# Patient Record
Sex: Female | Born: 1980 | Race: Black or African American | Hispanic: No | Marital: Single | Smoking: Never smoker
Health system: Southern US, Community
[De-identification: ages and names within clinical notes are randomized; demographics above are authoritative.]

## PROBLEM LIST (undated history)

## (undated) DIAGNOSIS — E669 Obesity, unspecified: Secondary | ICD-10-CM

## (undated) DIAGNOSIS — K219 Gastro-esophageal reflux disease without esophagitis: Secondary | ICD-10-CM

---
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
## 2000-02-01 ENCOUNTER — Encounter: Admission: RE | Admit: 2000-02-01 | Discharge: 2000-02-01 | Payer: Self-pay | Admitting: Family Medicine

## 2000-02-15 ENCOUNTER — Emergency Department (HOSPITAL_COMMUNITY): Admission: EM | Admit: 2000-02-15 | Discharge: 2000-02-15 | Payer: Self-pay | Admitting: Emergency Medicine

## 2000-02-15 ENCOUNTER — Encounter: Payer: Self-pay | Admitting: Emergency Medicine

## 2000-02-29 ENCOUNTER — Encounter: Admission: RE | Admit: 2000-02-29 | Discharge: 2000-02-29 | Payer: Self-pay | Admitting: Family Medicine

## 2000-04-06 ENCOUNTER — Encounter: Admission: RE | Admit: 2000-04-06 | Discharge: 2000-04-06 | Payer: Self-pay | Admitting: Family Medicine

## 2000-04-11 ENCOUNTER — Encounter: Admission: RE | Admit: 2000-04-11 | Discharge: 2000-04-11 | Payer: Self-pay | Admitting: *Deleted

## 2000-04-18 ENCOUNTER — Encounter: Admission: RE | Admit: 2000-04-18 | Discharge: 2000-04-18 | Payer: Self-pay | Admitting: Family Medicine

## 2000-05-18 ENCOUNTER — Encounter: Admission: RE | Admit: 2000-05-18 | Discharge: 2000-05-18 | Payer: Self-pay | Admitting: Family Medicine

## 2000-07-10 ENCOUNTER — Encounter: Admission: RE | Admit: 2000-07-10 | Discharge: 2000-07-10 | Payer: Self-pay | Admitting: Family Medicine

## 2000-07-10 ENCOUNTER — Other Ambulatory Visit: Admission: RE | Admit: 2000-07-10 | Discharge: 2000-07-10 | Payer: Self-pay | Admitting: *Deleted

## 2000-07-19 ENCOUNTER — Encounter: Payer: Self-pay | Admitting: Emergency Medicine

## 2000-07-19 ENCOUNTER — Emergency Department (HOSPITAL_COMMUNITY): Admission: EM | Admit: 2000-07-19 | Discharge: 2000-07-19 | Payer: Self-pay | Admitting: Emergency Medicine

## 2000-07-24 ENCOUNTER — Encounter: Admission: RE | Admit: 2000-07-24 | Discharge: 2000-07-24 | Payer: Self-pay | Admitting: Family Medicine

## 2000-08-22 ENCOUNTER — Encounter: Admission: RE | Admit: 2000-08-22 | Discharge: 2000-08-22 | Payer: Self-pay | Admitting: Family Medicine

## 2000-10-04 ENCOUNTER — Encounter: Admission: RE | Admit: 2000-10-04 | Discharge: 2000-10-04 | Payer: Self-pay | Admitting: Sports Medicine

## 2001-03-06 ENCOUNTER — Other Ambulatory Visit: Admission: RE | Admit: 2001-03-06 | Discharge: 2001-03-06 | Payer: Self-pay | Admitting: Sports Medicine

## 2001-03-06 ENCOUNTER — Encounter: Admission: RE | Admit: 2001-03-06 | Discharge: 2001-03-06 | Payer: Self-pay | Admitting: Family Medicine

## 2001-05-20 ENCOUNTER — Encounter: Admission: RE | Admit: 2001-05-20 | Discharge: 2001-05-20 | Payer: Self-pay | Admitting: Family Medicine

## 2001-06-07 ENCOUNTER — Encounter: Admission: RE | Admit: 2001-06-07 | Discharge: 2001-06-07 | Payer: Self-pay | Admitting: Family Medicine

## 2001-07-09 ENCOUNTER — Encounter: Admission: RE | Admit: 2001-07-09 | Discharge: 2001-07-09 | Payer: Self-pay | Admitting: Family Medicine

## 2001-07-22 ENCOUNTER — Encounter: Admission: RE | Admit: 2001-07-22 | Discharge: 2001-07-22 | Payer: Self-pay | Admitting: Family Medicine

## 2001-07-30 ENCOUNTER — Encounter: Admission: RE | Admit: 2001-07-30 | Discharge: 2001-07-30 | Payer: Self-pay | Admitting: Sports Medicine

## 2001-07-30 ENCOUNTER — Encounter: Payer: Self-pay | Admitting: Sports Medicine

## 2001-09-04 ENCOUNTER — Encounter: Admission: RE | Admit: 2001-09-04 | Discharge: 2001-09-04 | Payer: Self-pay | Admitting: Family Medicine

## 2001-10-21 ENCOUNTER — Encounter: Admission: RE | Admit: 2001-10-21 | Discharge: 2001-10-21 | Payer: Self-pay | Admitting: Family Medicine

## 2001-12-15 ENCOUNTER — Emergency Department (HOSPITAL_COMMUNITY): Admission: EM | Admit: 2001-12-15 | Discharge: 2001-12-15 | Payer: Self-pay | Admitting: Emergency Medicine

## 2002-02-19 ENCOUNTER — Encounter: Admission: RE | Admit: 2002-02-19 | Discharge: 2002-02-19 | Payer: Self-pay | Admitting: Family Medicine

## 2002-02-19 ENCOUNTER — Other Ambulatory Visit: Admission: RE | Admit: 2002-02-19 | Discharge: 2002-02-19 | Payer: Self-pay | Admitting: Family Medicine

## 2002-03-13 ENCOUNTER — Encounter: Admission: RE | Admit: 2002-03-13 | Discharge: 2002-03-13 | Payer: Self-pay | Admitting: Family Medicine

## 2002-04-15 ENCOUNTER — Encounter: Admission: RE | Admit: 2002-04-15 | Discharge: 2002-04-15 | Payer: Self-pay | Admitting: Sports Medicine

## 2002-07-17 ENCOUNTER — Encounter: Admission: RE | Admit: 2002-07-17 | Discharge: 2002-07-17 | Payer: Self-pay | Admitting: Family Medicine

## 2002-10-23 ENCOUNTER — Encounter: Admission: RE | Admit: 2002-10-23 | Discharge: 2002-10-23 | Payer: Self-pay | Admitting: Family Medicine

## 2003-01-27 ENCOUNTER — Encounter: Admission: RE | Admit: 2003-01-27 | Discharge: 2003-01-27 | Payer: Self-pay | Admitting: Family Medicine

## 2003-10-16 ENCOUNTER — Encounter: Admission: RE | Admit: 2003-10-16 | Discharge: 2003-10-16 | Payer: Self-pay | Admitting: Family Medicine

## 2004-01-15 ENCOUNTER — Encounter: Admission: RE | Admit: 2004-01-15 | Discharge: 2004-01-15 | Payer: Self-pay | Admitting: Family Medicine

## 2004-01-21 ENCOUNTER — Encounter: Admission: RE | Admit: 2004-01-21 | Discharge: 2004-01-21 | Payer: Self-pay | Admitting: Family Medicine

## 2004-03-09 ENCOUNTER — Encounter: Admission: RE | Admit: 2004-03-09 | Discharge: 2004-03-09 | Payer: Self-pay | Admitting: Family Medicine

## 2004-03-24 ENCOUNTER — Encounter: Admission: RE | Admit: 2004-03-24 | Discharge: 2004-03-24 | Payer: Self-pay | Admitting: Sports Medicine

## 2004-04-27 ENCOUNTER — Encounter: Admission: RE | Admit: 2004-04-27 | Discharge: 2004-04-27 | Payer: Self-pay | Admitting: Family Medicine

## 2004-06-14 ENCOUNTER — Encounter: Admission: RE | Admit: 2004-06-14 | Discharge: 2004-06-14 | Payer: Self-pay | Admitting: Family Medicine

## 2004-09-01 ENCOUNTER — Ambulatory Visit: Payer: Self-pay | Admitting: Family Medicine

## 2004-09-06 ENCOUNTER — Ambulatory Visit: Payer: Self-pay | Admitting: Family Medicine

## 2004-09-22 ENCOUNTER — Ambulatory Visit: Payer: Self-pay | Admitting: Sports Medicine

## 2004-10-03 ENCOUNTER — Ambulatory Visit: Payer: Self-pay | Admitting: Family Medicine

## 2004-10-19 ENCOUNTER — Ambulatory Visit: Payer: Self-pay | Admitting: Family Medicine

## 2004-10-24 ENCOUNTER — Ambulatory Visit (HOSPITAL_COMMUNITY): Admission: RE | Admit: 2004-10-24 | Discharge: 2004-10-24 | Payer: Self-pay | Admitting: Family Medicine

## 2004-11-24 ENCOUNTER — Ambulatory Visit: Payer: Self-pay | Admitting: Family Medicine

## 2005-01-03 ENCOUNTER — Ambulatory Visit: Payer: Self-pay | Admitting: Family Medicine

## 2005-02-01 ENCOUNTER — Ambulatory Visit: Payer: Self-pay | Admitting: Family Medicine

## 2005-02-02 ENCOUNTER — Ambulatory Visit (HOSPITAL_COMMUNITY): Admission: RE | Admit: 2005-02-02 | Discharge: 2005-02-02 | Payer: Self-pay | Admitting: Family Medicine

## 2005-02-09 ENCOUNTER — Ambulatory Visit: Payer: Self-pay | Admitting: Family Medicine

## 2005-02-27 ENCOUNTER — Ambulatory Visit: Payer: Self-pay | Admitting: Family Medicine

## 2005-03-31 ENCOUNTER — Ambulatory Visit: Payer: Self-pay | Admitting: Family Medicine

## 2005-04-10 ENCOUNTER — Ambulatory Visit: Payer: Self-pay | Admitting: Family Medicine

## 2005-04-10 ENCOUNTER — Ambulatory Visit (HOSPITAL_COMMUNITY): Admission: RE | Admit: 2005-04-10 | Discharge: 2005-04-10 | Payer: Self-pay | Admitting: *Deleted

## 2005-04-24 ENCOUNTER — Ambulatory Visit: Payer: Self-pay | Admitting: Family Medicine

## 2005-05-09 ENCOUNTER — Ambulatory Visit: Payer: Self-pay | Admitting: Obstetrics & Gynecology

## 2005-05-09 ENCOUNTER — Observation Stay (HOSPITAL_COMMUNITY): Admission: AD | Admit: 2005-05-09 | Discharge: 2005-05-10 | Payer: Self-pay | Admitting: Obstetrics & Gynecology

## 2005-05-12 ENCOUNTER — Ambulatory Visit: Payer: Self-pay | Admitting: Family Medicine

## 2005-05-17 ENCOUNTER — Ambulatory Visit: Payer: Self-pay | Admitting: Family Medicine

## 2005-05-30 ENCOUNTER — Ambulatory Visit: Payer: Self-pay | Admitting: Family Medicine

## 2005-06-07 ENCOUNTER — Ambulatory Visit: Payer: Self-pay | Admitting: Sports Medicine

## 2005-06-09 ENCOUNTER — Ambulatory Visit: Payer: Self-pay | Admitting: Obstetrics & Gynecology

## 2005-06-12 ENCOUNTER — Ambulatory Visit: Payer: Self-pay | Admitting: Family Medicine

## 2005-06-13 ENCOUNTER — Ambulatory Visit: Payer: Self-pay | Admitting: *Deleted

## 2005-06-16 ENCOUNTER — Ambulatory Visit: Payer: Self-pay | Admitting: *Deleted

## 2005-06-20 ENCOUNTER — Inpatient Hospital Stay (HOSPITAL_COMMUNITY): Admission: AD | Admit: 2005-06-20 | Discharge: 2005-06-22 | Payer: Self-pay | Admitting: Obstetrics & Gynecology

## 2005-06-20 ENCOUNTER — Ambulatory Visit: Payer: Self-pay | Admitting: *Deleted

## 2005-07-31 ENCOUNTER — Ambulatory Visit: Payer: Self-pay | Admitting: Family Medicine

## 2005-09-04 ENCOUNTER — Ambulatory Visit: Payer: Self-pay | Admitting: Sports Medicine

## 2005-09-05 LAB — CONVERTED CEMR LAB
HDL: 54 mg/dL
LDL Cholesterol: 71 mg/dL
Triglycerides: 55 mg/dL

## 2005-10-06 ENCOUNTER — Encounter (INDEPENDENT_AMBULATORY_CARE_PROVIDER_SITE_OTHER): Payer: Self-pay | Admitting: *Deleted

## 2005-10-06 LAB — CONVERTED CEMR LAB

## 2005-10-24 ENCOUNTER — Other Ambulatory Visit: Admission: RE | Admit: 2005-10-24 | Discharge: 2005-10-24 | Payer: Self-pay | Admitting: Family Medicine

## 2005-10-24 ENCOUNTER — Ambulatory Visit: Payer: Self-pay | Admitting: Sports Medicine

## 2007-01-03 DIAGNOSIS — D259 Leiomyoma of uterus, unspecified: Secondary | ICD-10-CM | POA: Insufficient documentation

## 2007-01-03 DIAGNOSIS — K219 Gastro-esophageal reflux disease without esophagitis: Secondary | ICD-10-CM | POA: Insufficient documentation

## 2007-01-04 ENCOUNTER — Encounter (INDEPENDENT_AMBULATORY_CARE_PROVIDER_SITE_OTHER): Payer: Self-pay | Admitting: *Deleted

## 2007-02-18 ENCOUNTER — Encounter (INDEPENDENT_AMBULATORY_CARE_PROVIDER_SITE_OTHER): Payer: Self-pay | Admitting: Family Medicine

## 2007-02-18 ENCOUNTER — Other Ambulatory Visit: Admission: RE | Admit: 2007-02-18 | Discharge: 2007-02-18 | Payer: Self-pay | Admitting: Family Medicine

## 2007-02-18 ENCOUNTER — Ambulatory Visit: Payer: Self-pay | Admitting: Sports Medicine

## 2007-02-18 DIAGNOSIS — J309 Allergic rhinitis, unspecified: Secondary | ICD-10-CM | POA: Insufficient documentation

## 2007-02-18 DIAGNOSIS — E669 Obesity, unspecified: Secondary | ICD-10-CM

## 2007-02-18 DIAGNOSIS — J45909 Unspecified asthma, uncomplicated: Secondary | ICD-10-CM | POA: Insufficient documentation

## 2007-02-18 LAB — CONVERTED CEMR LAB
Chlamydia, DNA Probe: NEGATIVE
GC Probe Amp, Genital: NEGATIVE
Pap Smear: NORMAL

## 2007-06-11 ENCOUNTER — Ambulatory Visit: Payer: Self-pay | Admitting: Family Medicine

## 2007-08-01 ENCOUNTER — Ambulatory Visit: Payer: Self-pay | Admitting: Family Medicine

## 2007-08-01 ENCOUNTER — Telehealth (INDEPENDENT_AMBULATORY_CARE_PROVIDER_SITE_OTHER): Payer: Self-pay | Admitting: *Deleted

## 2007-08-01 ENCOUNTER — Encounter: Payer: Self-pay | Admitting: *Deleted

## 2007-09-20 ENCOUNTER — Ambulatory Visit: Payer: Self-pay | Admitting: Family Medicine

## 2007-09-20 ENCOUNTER — Encounter: Payer: Self-pay | Admitting: Family Medicine

## 2007-09-23 LAB — CONVERTED CEMR LAB: GC Probe Amp, Genital: NEGATIVE

## 2007-09-24 ENCOUNTER — Ambulatory Visit: Payer: Self-pay | Admitting: Family Medicine

## 2007-11-18 ENCOUNTER — Telehealth: Payer: Self-pay | Admitting: *Deleted

## 2007-11-19 ENCOUNTER — Encounter (INDEPENDENT_AMBULATORY_CARE_PROVIDER_SITE_OTHER): Payer: Self-pay | Admitting: Family Medicine

## 2007-11-19 ENCOUNTER — Ambulatory Visit: Payer: Self-pay | Admitting: Family Medicine

## 2007-11-19 LAB — CONVERTED CEMR LAB: GC Probe Amp, Genital: NEGATIVE

## 2008-01-16 ENCOUNTER — Telehealth: Payer: Self-pay | Admitting: *Deleted

## 2008-01-16 ENCOUNTER — Encounter: Payer: Self-pay | Admitting: Family Medicine

## 2008-01-16 ENCOUNTER — Ambulatory Visit: Payer: Self-pay | Admitting: Sports Medicine

## 2008-01-29 ENCOUNTER — Ambulatory Visit: Payer: Self-pay

## 2008-01-29 ENCOUNTER — Encounter (INDEPENDENT_AMBULATORY_CARE_PROVIDER_SITE_OTHER): Payer: Self-pay | Admitting: Family Medicine

## 2008-01-29 ENCOUNTER — Telehealth: Payer: Self-pay | Admitting: *Deleted

## 2008-03-24 ENCOUNTER — Emergency Department (HOSPITAL_COMMUNITY): Admission: EM | Admit: 2008-03-24 | Discharge: 2008-03-24 | Payer: Self-pay | Admitting: Emergency Medicine

## 2008-07-20 ENCOUNTER — Telehealth: Payer: Self-pay | Admitting: *Deleted

## 2008-07-20 ENCOUNTER — Ambulatory Visit: Payer: Self-pay | Admitting: Family Medicine

## 2008-07-20 ENCOUNTER — Encounter: Payer: Self-pay | Admitting: Family Medicine

## 2008-07-20 LAB — CONVERTED CEMR LAB
Beta hcg, urine, semiquantitative: NEGATIVE
Chlamydia, DNA Probe: NEGATIVE
GC Probe Amp, Genital: NEGATIVE

## 2008-07-21 ENCOUNTER — Encounter: Payer: Self-pay | Admitting: Family Medicine

## 2008-07-26 ENCOUNTER — Emergency Department (HOSPITAL_COMMUNITY): Admission: EM | Admit: 2008-07-26 | Discharge: 2008-07-26 | Payer: Self-pay | Admitting: Family Medicine

## 2008-08-05 ENCOUNTER — Ambulatory Visit: Payer: Self-pay | Admitting: Family Medicine

## 2008-08-05 LAB — CONVERTED CEMR LAB: Beta hcg, urine, semiquantitative: POSITIVE

## 2008-08-17 ENCOUNTER — Ambulatory Visit: Payer: Self-pay | Admitting: Family Medicine

## 2008-08-17 ENCOUNTER — Telehealth: Payer: Self-pay | Admitting: Family Medicine

## 2008-08-17 ENCOUNTER — Telehealth: Payer: Self-pay | Admitting: *Deleted

## 2008-08-24 ENCOUNTER — Ambulatory Visit: Payer: Self-pay | Admitting: Family Medicine

## 2008-08-24 ENCOUNTER — Encounter (INDEPENDENT_AMBULATORY_CARE_PROVIDER_SITE_OTHER): Payer: Self-pay | Admitting: Family Medicine

## 2008-08-24 LAB — CONVERTED CEMR LAB
ABO/RH(D): B POS
Antibody Screen: NEGATIVE
Basophils Absolute: 0 10*3/uL (ref 0.0–0.1)
Eosinophils Absolute: 0 10*3/uL (ref 0.0–0.7)
Hepatitis B Surface Ag: NEGATIVE
Lymphocytes Relative: 27 % (ref 12–46)
MCV: 84 fL (ref 78.0–100.0)
Monocytes Absolute: 0.6 10*3/uL (ref 0.1–1.0)
Rh Type: POSITIVE
Rubella: 194.9 intl units/mL — ABNORMAL HIGH
WBC: 4.6 10*3/uL (ref 4.0–10.5)

## 2008-08-25 ENCOUNTER — Encounter (INDEPENDENT_AMBULATORY_CARE_PROVIDER_SITE_OTHER): Payer: Self-pay | Admitting: Family Medicine

## 2008-08-31 ENCOUNTER — Encounter: Payer: Self-pay | Admitting: Family Medicine

## 2008-08-31 ENCOUNTER — Encounter (INDEPENDENT_AMBULATORY_CARE_PROVIDER_SITE_OTHER): Payer: Self-pay | Admitting: Family Medicine

## 2008-08-31 ENCOUNTER — Ambulatory Visit: Payer: Self-pay | Admitting: Family Medicine

## 2008-09-02 ENCOUNTER — Telehealth: Payer: Self-pay | Admitting: *Deleted

## 2008-09-02 ENCOUNTER — Encounter (INDEPENDENT_AMBULATORY_CARE_PROVIDER_SITE_OTHER): Payer: Self-pay | Admitting: Family Medicine

## 2008-09-04 ENCOUNTER — Ambulatory Visit: Payer: Self-pay | Admitting: Family Medicine

## 2008-09-07 ENCOUNTER — Ambulatory Visit: Payer: Self-pay | Admitting: Family Medicine

## 2008-09-07 ENCOUNTER — Telehealth: Payer: Self-pay | Admitting: *Deleted

## 2008-09-15 ENCOUNTER — Encounter: Payer: Self-pay | Admitting: Family Medicine

## 2008-09-15 ENCOUNTER — Telehealth: Payer: Self-pay | Admitting: *Deleted

## 2008-09-15 ENCOUNTER — Inpatient Hospital Stay (HOSPITAL_COMMUNITY): Admission: AD | Admit: 2008-09-15 | Discharge: 2008-09-16 | Payer: Self-pay | Admitting: Obstetrics & Gynecology

## 2008-09-21 ENCOUNTER — Ambulatory Visit (HOSPITAL_COMMUNITY): Admission: RE | Admit: 2008-09-21 | Discharge: 2008-09-21 | Payer: Self-pay | Admitting: Family Medicine

## 2008-09-29 ENCOUNTER — Ambulatory Visit (HOSPITAL_COMMUNITY): Admission: RE | Admit: 2008-09-29 | Discharge: 2008-09-29 | Payer: Self-pay | Admitting: Emergency Medicine

## 2008-09-29 ENCOUNTER — Encounter: Payer: Self-pay | Admitting: Family Medicine

## 2008-09-29 ENCOUNTER — Ambulatory Visit: Payer: Self-pay | Admitting: Family Medicine

## 2008-10-05 ENCOUNTER — Encounter (INDEPENDENT_AMBULATORY_CARE_PROVIDER_SITE_OTHER): Payer: Self-pay | Admitting: Family Medicine

## 2008-10-09 ENCOUNTER — Encounter (INDEPENDENT_AMBULATORY_CARE_PROVIDER_SITE_OTHER): Payer: Self-pay | Admitting: Family Medicine

## 2008-10-14 ENCOUNTER — Ambulatory Visit: Payer: Self-pay | Admitting: Family Medicine

## 2008-10-14 ENCOUNTER — Encounter: Payer: Self-pay | Admitting: *Deleted

## 2008-10-20 ENCOUNTER — Ambulatory Visit (HOSPITAL_COMMUNITY): Admission: RE | Admit: 2008-10-20 | Discharge: 2008-10-20 | Payer: Self-pay | Admitting: Emergency Medicine

## 2008-10-27 ENCOUNTER — Encounter (INDEPENDENT_AMBULATORY_CARE_PROVIDER_SITE_OTHER): Payer: Self-pay | Admitting: Family Medicine

## 2008-11-10 ENCOUNTER — Ambulatory Visit (HOSPITAL_COMMUNITY): Admission: RE | Admit: 2008-11-10 | Discharge: 2008-11-10 | Payer: Self-pay | Admitting: Family Medicine

## 2008-11-16 ENCOUNTER — Ambulatory Visit: Payer: Self-pay | Admitting: Family Medicine

## 2008-11-16 ENCOUNTER — Encounter (INDEPENDENT_AMBULATORY_CARE_PROVIDER_SITE_OTHER): Payer: Self-pay | Admitting: Family Medicine

## 2008-11-17 ENCOUNTER — Encounter (INDEPENDENT_AMBULATORY_CARE_PROVIDER_SITE_OTHER): Payer: Self-pay | Admitting: Family Medicine

## 2008-12-11 ENCOUNTER — Telehealth: Payer: Self-pay | Admitting: *Deleted

## 2008-12-17 ENCOUNTER — Encounter: Payer: Self-pay | Admitting: Family Medicine

## 2008-12-17 ENCOUNTER — Ambulatory Visit: Payer: Self-pay | Admitting: Family Medicine

## 2009-01-05 ENCOUNTER — Ambulatory Visit (HOSPITAL_COMMUNITY): Admission: RE | Admit: 2009-01-05 | Discharge: 2009-01-05 | Payer: Self-pay | Admitting: Family Medicine

## 2009-01-06 ENCOUNTER — Ambulatory Visit: Payer: Self-pay | Admitting: Family Medicine

## 2009-01-06 ENCOUNTER — Encounter: Payer: Self-pay | Admitting: Family Medicine

## 2009-01-06 LAB — CONVERTED CEMR LAB
HCT: 35.8 % — ABNORMAL LOW (ref 36.0–46.0)
Hemoglobin: 12.2 g/dL (ref 12.0–15.0)
MCHC: 34.1 g/dL (ref 30.0–36.0)
MCV: 83.4 fL (ref 78.0–100.0)
Platelets: 233 10*3/uL (ref 150–400)
RDW: 13.8 % (ref 11.5–15.5)

## 2009-01-18 ENCOUNTER — Telehealth (INDEPENDENT_AMBULATORY_CARE_PROVIDER_SITE_OTHER): Payer: Self-pay | Admitting: Family Medicine

## 2009-01-18 ENCOUNTER — Encounter (INDEPENDENT_AMBULATORY_CARE_PROVIDER_SITE_OTHER): Payer: Self-pay | Admitting: Family Medicine

## 2009-01-18 ENCOUNTER — Ambulatory Visit: Payer: Self-pay | Admitting: Family Medicine

## 2009-01-26 ENCOUNTER — Telehealth (INDEPENDENT_AMBULATORY_CARE_PROVIDER_SITE_OTHER): Payer: Self-pay | Admitting: Family Medicine

## 2009-01-26 ENCOUNTER — Ambulatory Visit: Payer: Self-pay | Admitting: Family Medicine

## 2009-02-01 ENCOUNTER — Ambulatory Visit: Payer: Self-pay | Admitting: Family Medicine

## 2009-02-17 ENCOUNTER — Ambulatory Visit: Payer: Self-pay | Admitting: Family Medicine

## 2009-02-19 ENCOUNTER — Encounter (INDEPENDENT_AMBULATORY_CARE_PROVIDER_SITE_OTHER): Payer: Self-pay | Admitting: Family Medicine

## 2009-02-19 ENCOUNTER — Ambulatory Visit (HOSPITAL_COMMUNITY): Admission: RE | Admit: 2009-02-19 | Discharge: 2009-02-19 | Payer: Self-pay | Admitting: Family Medicine

## 2009-02-24 ENCOUNTER — Encounter: Payer: Self-pay | Admitting: Family Medicine

## 2009-03-03 ENCOUNTER — Ambulatory Visit: Payer: Self-pay | Admitting: Family Medicine

## 2009-03-03 ENCOUNTER — Encounter (INDEPENDENT_AMBULATORY_CARE_PROVIDER_SITE_OTHER): Payer: Self-pay | Admitting: Family Medicine

## 2009-03-03 LAB — CONVERTED CEMR LAB
Chlamydia, DNA Probe: NEGATIVE
GC Probe Amp, Genital: NEGATIVE
Whiff Test: NEGATIVE

## 2009-03-04 ENCOUNTER — Encounter (INDEPENDENT_AMBULATORY_CARE_PROVIDER_SITE_OTHER): Payer: Self-pay | Admitting: Family Medicine

## 2009-03-08 ENCOUNTER — Ambulatory Visit: Payer: Self-pay | Admitting: Family Medicine

## 2009-03-16 ENCOUNTER — Ambulatory Visit: Payer: Self-pay | Admitting: Family Medicine

## 2009-03-19 ENCOUNTER — Ambulatory Visit (HOSPITAL_COMMUNITY): Admission: RE | Admit: 2009-03-19 | Discharge: 2009-03-19 | Payer: Self-pay | Admitting: Family Medicine

## 2009-03-19 ENCOUNTER — Encounter: Payer: Self-pay | Admitting: Family Medicine

## 2009-03-22 ENCOUNTER — Ambulatory Visit (HOSPITAL_COMMUNITY): Admission: RE | Admit: 2009-03-22 | Discharge: 2009-03-22 | Payer: Self-pay | Admitting: Family Medicine

## 2009-03-22 ENCOUNTER — Encounter: Payer: Self-pay | Admitting: Family Medicine

## 2009-03-24 ENCOUNTER — Ambulatory Visit: Payer: Self-pay | Admitting: Family Medicine

## 2009-03-24 LAB — CONVERTED CEMR LAB
Bilirubin Urine: NEGATIVE
Ketones, urine, test strip: NEGATIVE
Specific Gravity, Urine: 1.01
Urobilinogen, UA: 0.2

## 2009-03-25 ENCOUNTER — Ambulatory Visit: Payer: Self-pay | Admitting: Obstetrics & Gynecology

## 2009-03-25 ENCOUNTER — Inpatient Hospital Stay (HOSPITAL_COMMUNITY): Admission: AD | Admit: 2009-03-25 | Discharge: 2009-03-28 | Payer: Self-pay | Admitting: Obstetrics & Gynecology

## 2009-03-25 ENCOUNTER — Ambulatory Visit: Payer: Self-pay | Admitting: Family Medicine

## 2009-03-25 ENCOUNTER — Ambulatory Visit (HOSPITAL_COMMUNITY): Admission: RE | Admit: 2009-03-25 | Discharge: 2009-03-25 | Payer: Self-pay | Admitting: Family Medicine

## 2009-03-25 ENCOUNTER — Encounter (INDEPENDENT_AMBULATORY_CARE_PROVIDER_SITE_OTHER): Payer: Self-pay | Admitting: Family Medicine

## 2009-03-26 ENCOUNTER — Encounter (INDEPENDENT_AMBULATORY_CARE_PROVIDER_SITE_OTHER): Payer: Self-pay | Admitting: Family Medicine

## 2009-03-29 ENCOUNTER — Telehealth: Payer: Self-pay | Admitting: *Deleted

## 2009-04-02 ENCOUNTER — Encounter: Payer: Self-pay | Admitting: Family Medicine

## 2009-04-02 ENCOUNTER — Ambulatory Visit: Payer: Self-pay | Admitting: Family Medicine

## 2009-04-07 ENCOUNTER — Encounter (INDEPENDENT_AMBULATORY_CARE_PROVIDER_SITE_OTHER): Payer: Self-pay | Admitting: Family Medicine

## 2009-04-22 ENCOUNTER — Inpatient Hospital Stay (HOSPITAL_COMMUNITY): Admission: AD | Admit: 2009-04-22 | Discharge: 2009-04-22 | Payer: Self-pay | Admitting: Obstetrics & Gynecology

## 2009-04-22 ENCOUNTER — Ambulatory Visit: Payer: Self-pay | Admitting: Obstetrics and Gynecology

## 2009-04-28 ENCOUNTER — Ambulatory Visit: Payer: Self-pay | Admitting: Family Medicine

## 2009-04-28 ENCOUNTER — Encounter (INDEPENDENT_AMBULATORY_CARE_PROVIDER_SITE_OTHER): Payer: Self-pay | Admitting: Family Medicine

## 2009-04-28 LAB — CONVERTED CEMR LAB: Rapid Strep: NEGATIVE

## 2009-05-14 ENCOUNTER — Ambulatory Visit: Payer: Self-pay | Admitting: Family Medicine

## 2009-05-21 ENCOUNTER — Inpatient Hospital Stay (HOSPITAL_COMMUNITY): Admission: AD | Admit: 2009-05-21 | Discharge: 2009-05-21 | Payer: Self-pay | Admitting: Obstetrics & Gynecology

## 2009-08-04 ENCOUNTER — Encounter: Payer: Self-pay | Admitting: Family Medicine

## 2009-12-07 ENCOUNTER — Encounter: Payer: Self-pay | Admitting: *Deleted

## 2010-07-26 ENCOUNTER — Encounter: Payer: Self-pay | Admitting: Family Medicine

## 2010-07-26 ENCOUNTER — Ambulatory Visit: Payer: Self-pay | Admitting: Family Medicine

## 2010-07-26 LAB — CONVERTED CEMR LAB: Chlamydia, DNA Probe: NEGATIVE

## 2010-07-27 ENCOUNTER — Telehealth: Payer: Self-pay | Admitting: *Deleted

## 2010-08-03 LAB — CONVERTED CEMR LAB

## 2010-08-04 ENCOUNTER — Telehealth: Payer: Self-pay | Admitting: *Deleted

## 2010-08-18 ENCOUNTER — Ambulatory Visit: Payer: Self-pay | Admitting: Family Medicine

## 2010-08-18 DIAGNOSIS — R87610 Atypical squamous cells of undetermined significance on cytologic smear of cervix (ASC-US): Secondary | ICD-10-CM

## 2010-08-23 ENCOUNTER — Encounter: Payer: Self-pay | Admitting: Family Medicine

## 2010-09-03 IMAGING — US US OB FOLLOW-UP
1 series · 18 of 27 positions shown · non-contrast
Comparison: none

OBSTETRICAL ULTRASOUND:
 This ultrasound was performed in The [HOSPITAL], and the [facility] report will be stored to [REDACTED] PACS.

[Series 1: us ob follow-up · 27 acquisitions, 18 frames shown]
[im 1/27]
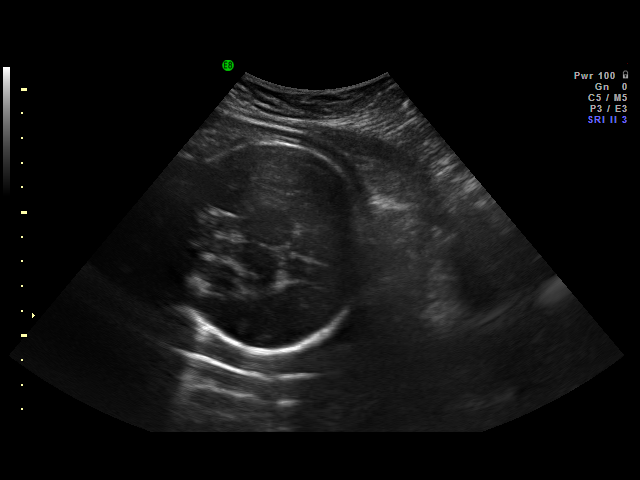
[im 3/27]
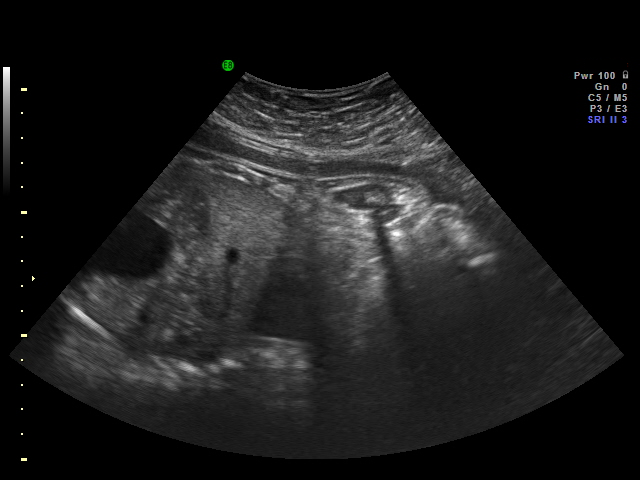
[im 4/27]
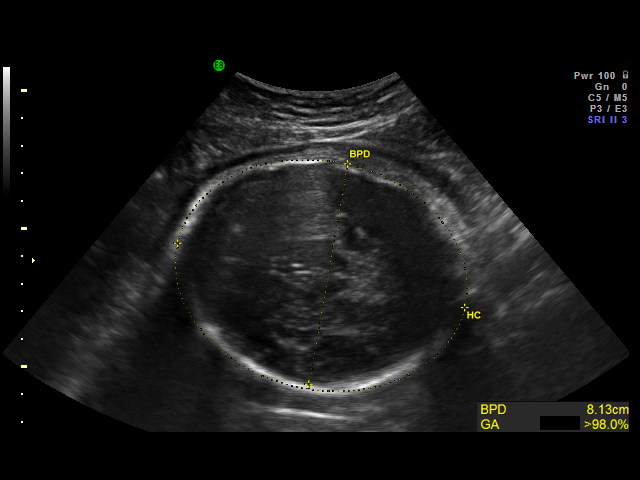
[im 6/27]
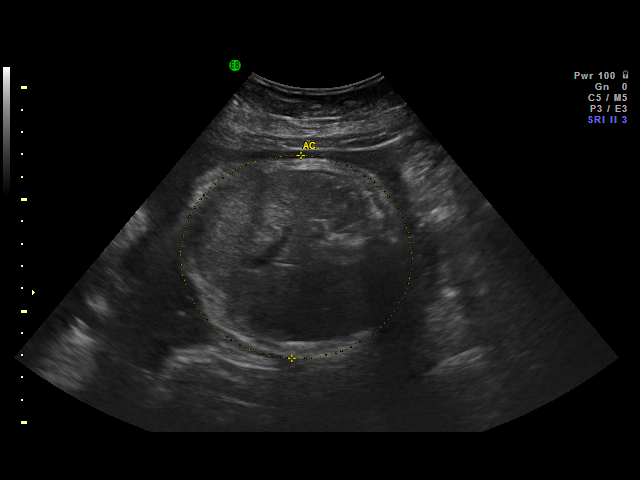
[im 7/27]
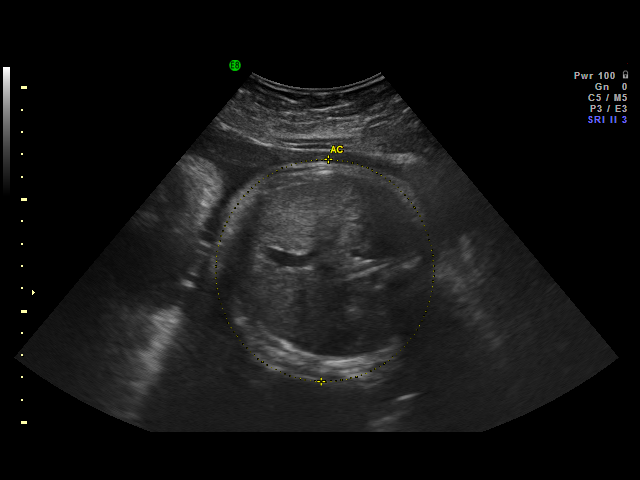
[im 9/27]
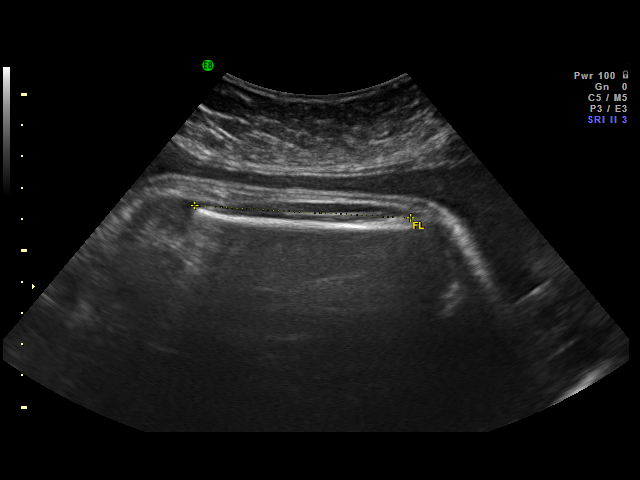
[im 10/27]
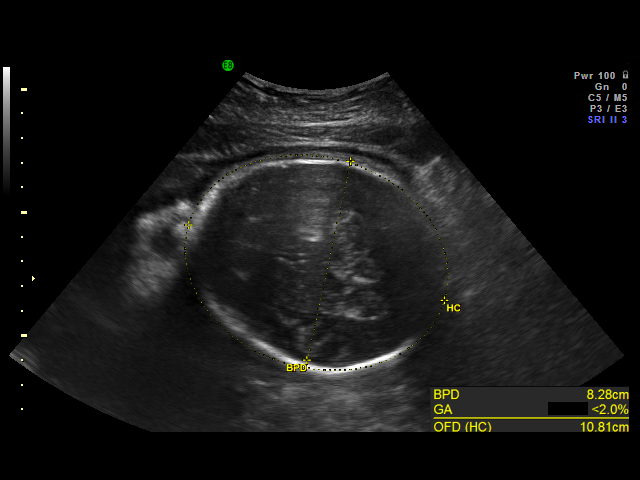
[im 12/27]
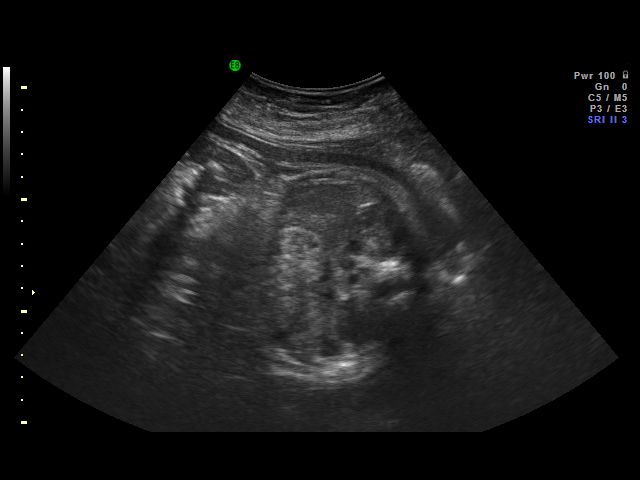
[im 13/27]
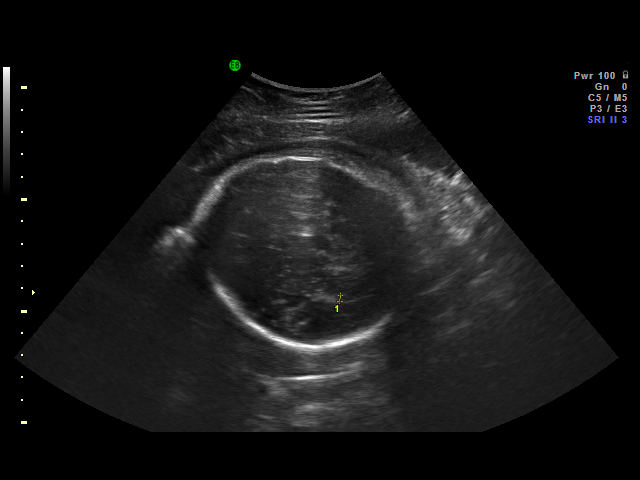
[im 15/27]
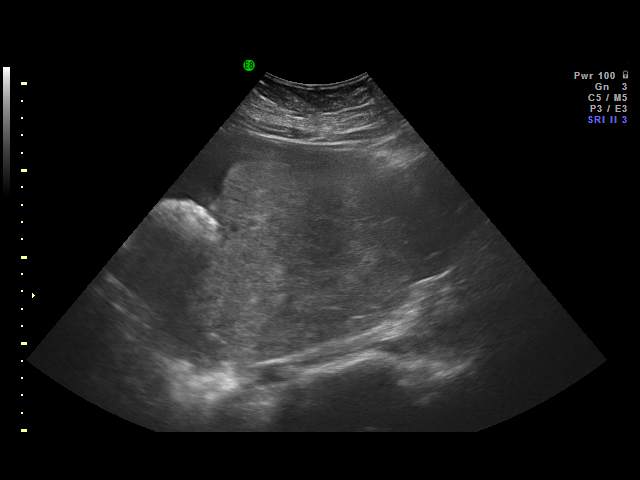
[im 16/27]
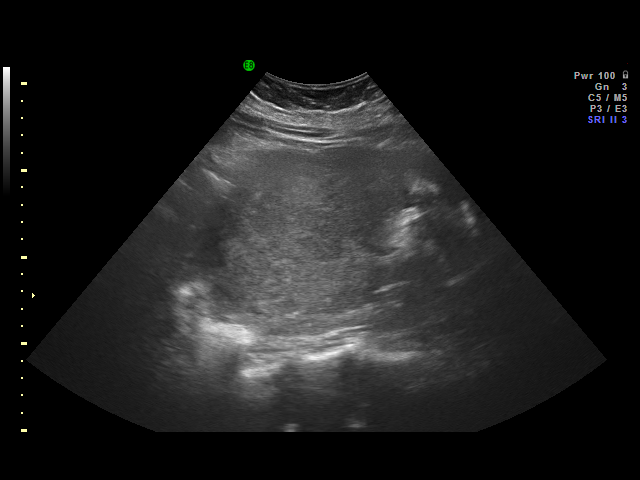
[im 18/27]
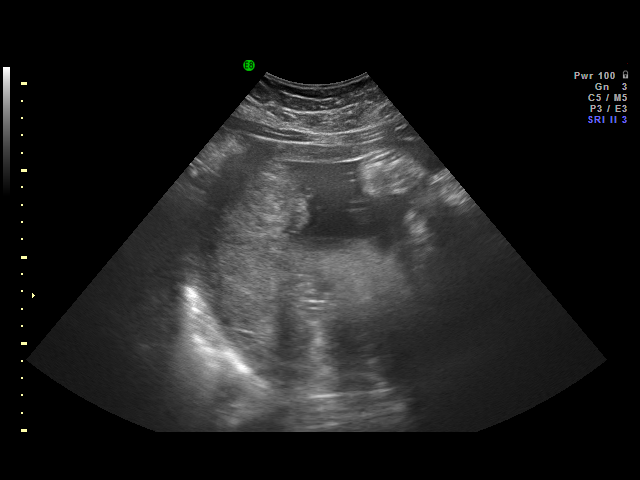
[im 19/27]
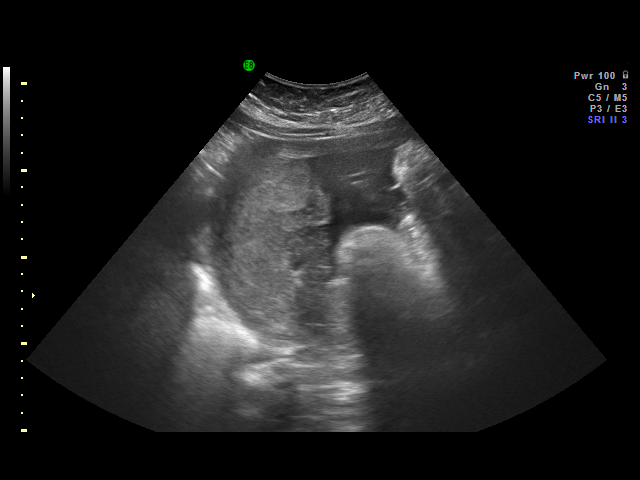
[im 21/27]
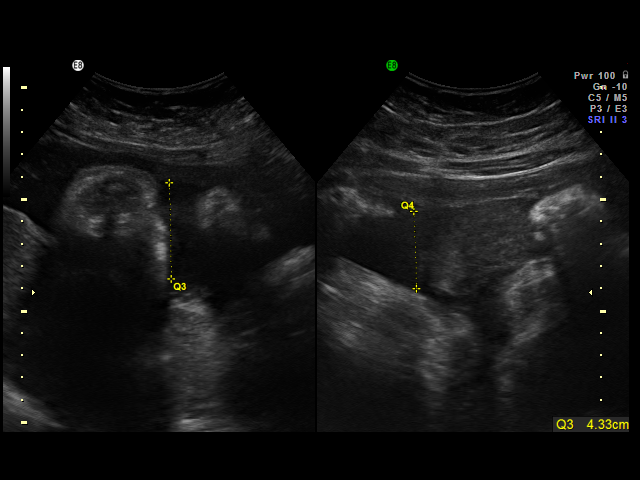
[im 22/27]
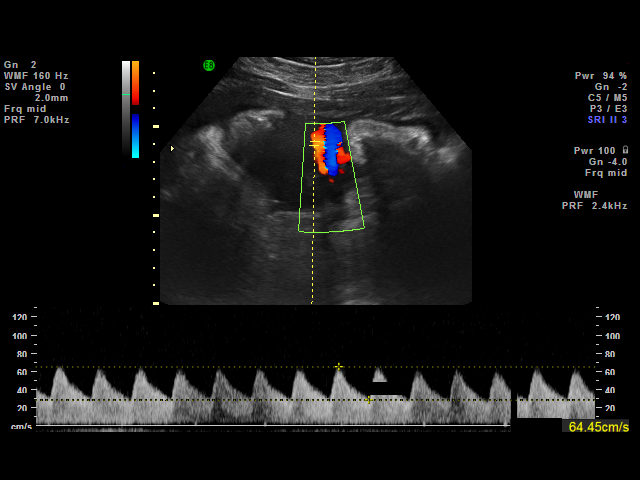
[im 24/27]
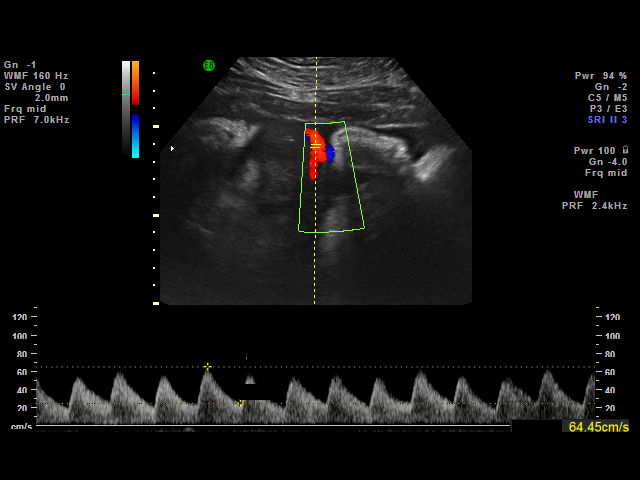
[im 25/27]
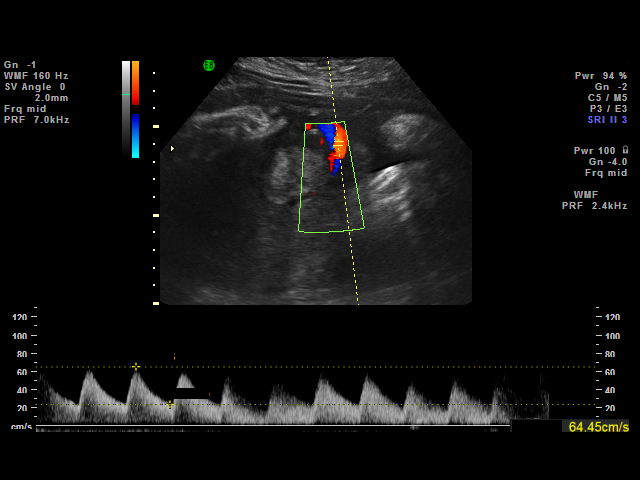
[im 27/27]
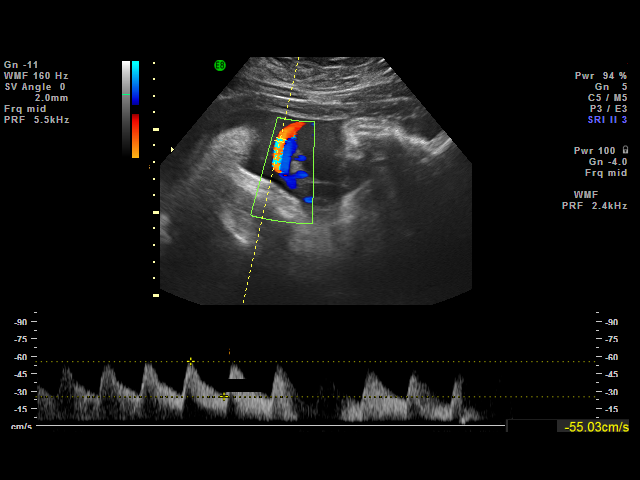

[18 of 27 positions shown; findings below may reference images not displayed]

IMPRESSION: [facility] OB/[facility]N has also been faxed to the ordering physician.

## 2010-09-15 ENCOUNTER — Encounter: Payer: Self-pay | Admitting: *Deleted

## 2010-09-15 ENCOUNTER — Telehealth: Payer: Self-pay | Admitting: *Deleted

## 2010-09-15 ENCOUNTER — Ambulatory Visit: Payer: Self-pay | Admitting: Family Medicine

## 2010-11-14 ENCOUNTER — Encounter: Payer: Self-pay | Admitting: Family Medicine

## 2010-11-23 ENCOUNTER — Encounter: Payer: Self-pay | Admitting: *Deleted

## 2010-12-06 ENCOUNTER — Emergency Department (HOSPITAL_COMMUNITY)
Admission: EM | Admit: 2010-12-06 | Discharge: 2010-12-06 | Payer: Self-pay | Source: Home / Self Care | Admitting: Family Medicine

## 2010-12-06 NOTE — Progress Notes (Signed)
Summary: re: colposcopy/TS   Phone Note Outgoing Call   Call placed by: Loralee Pacas CMA,  August 04, 2010 10:48 AM Summary of Call: called pt and lvm for her to call back so that she can get scheduled with dr. Jennette Kettle for colposcopy  Follow-up for Phone Call        called pt and sched. appt for colposcopy 08-11-10 at 10:30 [facility] will keep appt and understands procedure. Follow-up by: Arlyss Repress CMA,,  August 04, 2010 11:15 AM

## 2010-12-06 NOTE — Assessment & Plan Note (Signed)
Summary: Vulvovagintitis, PAP, Asthma   Vital Signs:  Patient profile:   30 year old female Height:      64.5 inches Weight:      303 pounds BMI:     51.39 Temp:     98.3 degrees F oral Pulse rate:   81 / minute BP sitting:   101 / 67  (left arm)  Vitals Entered By: Tessie Fass CMA (July 26, 2010 11:10 AM) CC: Vulcovagintits/ PAP, asthma Is Patient Diabetic? No Pain Assessment Patient in pain? no        Primary Care Provider:  Jamie Brookes MD  CC:  Vulcovagintits/ PAP and asthma.  History of Present Illness: Vulvovagintits/PAP: Pt comes in today for her pap and STD check. She is not having any problems currently. she does not need contraceptions because she had a tubal ligation. having some irritation in vaginal region. No problems with urination.   Asthma She does have occasional asthma attacks and would like a refill on her asthma med. No other concerns. all questions encouraged and answered.   Habits & Providers  Alcohol-Tobacco-Diet     Tobacco Status: quit  Current Medications (verified): 1)  Proventil Hfa 108 (90 Base) Mcg/act Aers (Albuterol Sulfate) .... Take 1 Puff Up To Every 4 Hours As Needed For Wheezing or Lung Tightness  Allergies (verified): No Known Drug Allergies  Social History: Smoking Status:  quit  Review of Systems       no concerns at this time.   Physical Exam  General:  Well-developed,well-nourished,in no acute distress; alert,appropriate and cooperative throughout examination Genitalia:  Normal introitus for age, no external lesions, no vaginal discharge, mucosa pink and moist, no vaginal or cervical lesions, no vaginal atrophy, no friaility or hemorrhage, normal uterus size and position, no adnexal masses or tenderness   Impression & Recommendations:  Problem # 1:  UNSPECIFIED VAGINITIS AND VULVOVAGINITIS (ICD-616.10) Pt has irritation and wants to know if she has any STD's. Plan to check GC/Chlam only.    Orders: GC/Chlamydia-[facility] (87591/87491) [facility]- Est Level  3 (51884)  Problem # 2:  SCREENING FOR MALIGNANT NEOPLASM OF THE CERVIX (ICD-V76.2) Pap done today. Will let patient know results of labs.  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 Only occasional symptoms.   Her updated medication list for this problem includes:    Proventil Hfa 108 (90 Base) Mcg/act Aers (Albuterol sulfate) .Marland Kitchen... Take 1 puff up to every 4 hours as needed for wheezing or lung tightness  Complete Medication List: 1)  Proventil Hfa 108 (90 Base) Mcg/act Aers (Albuterol sulfate) .... Take 1 puff up to every 4 hours as needed for wheezing or lung tightness  Patient Instructions: 1)  It was nice to meet you today.  2)  We will let you know your results.  3)  I sent in a prescription for your albuterol.  Prescriptions: PROVENTIL HFA 108 (90 BASE) MCG/ACT AERS (ALBUTEROL SULFATE) take 1 puff up to every 4 hours as needed for wheezing or lung tightness  #1 x 3   Entered and Authorized by:   Jamie Brookes MD   Signed by:   Jamie Brookes MD on 07/26/2010   Method used:   Electronically to        [facility] (retail)       [address]       [place],  [place]  [id number]       Ph: [phone]       Fax: [phone]   RxID:   [id number]

## 2010-12-06 NOTE — Letter (Signed)
Summary: Out of Work  [facility]  [address]   [place], [place] [address]   Phone: [phone]  Fax: [phone]    December 07, 2009   Employee:  MACYN SHROPSHIRE    To Whom It May Concern:   For Medical reasons, please excuse the above named employee from work for the following dates:  Start:   12/07/2009  End:   12/10/2009  If you need additional information, please feel free to contact our office.         Sincerely,    Loralee Pacas CMA

## 2010-12-06 NOTE — Progress Notes (Signed)
Summary: Triage call   Phone Note Call from Patient   Caller: Patient Call For: [phone] Summary of Call: Patient woke up with sorethroat with difficulty swallowing. " Feels like a knot to throat". Initial call taken by: Abundio Miu,  September 15, 2010 11:22 AM  Follow-up for Phone Call        Describes pain when she swallows, says it feels like "two peices of metal rubbing together".  Has not been exposed to strep as far as she knows but daughter is in daycare.  Gave her a [place] appt for 1:30.   Follow-up by: Dennison Nancy RN,  September 15, 2010 11:48 AM

## 2010-12-06 NOTE — Letter (Signed)
Summary: Out of Work  [facility]  [address]   [place], [place] [address]   Phone: [phone]  Fax: [phone]    September 15, 2010   Employee:  Monica Foster    To Whom It May Concern:   For Medical reasons, please excuse the above named employee from work for the following dates:  Start:   September 15, 2010   If you need additional information, please feel free to contact our office.         Sincerely,    Jone Baseman CMA

## 2010-12-06 NOTE — Assessment & Plan Note (Signed)
Summary: colposcopy/tlb(resch'd from 10/6)/bmc   Vital Signs:  Patient profile:   30 year old female Height:      64.5 inches Weight:      305 pounds BMI:     51.73 Temp:     98.1 degrees F oral Pulse rate:   72 / minute BP sitting:   114 / 75  (left arm) Cuff size:   large  Vitals Entered By: Garen Grams LPN (August 18, 2010 9:54 AM) CC: colpo Is Patient Diabetic? No Pain Assessment Patient in pain? no        Primary Care Provider:  Jamie Brookes MD  CC:  colpo.  History of Present Illness: ASCH on pap asx  Habits & Providers  Alcohol-Tobacco-Diet     Tobacco Status: quit     Cigarette Packs/Day: n/a  Current Medications (verified): 1)  Proventil Hfa 108 (90 Base) Mcg/act Aers (Albuterol Sulfate) .... Take 1 Puff Up To Every 4 Hours As Needed For Wheezing or Lung Tightness  Allergies: No Known Drug Allergies  Review of Systems  The patient denies fever.         no vaginal bleeding  Physical Exam  General:  alert, well-developed, well-nourished, well-hydrated, and overweight-appearing.   Genitalia:  normal introitus, no external lesions, and mucosa pink and moist.   Additional Exam:  Patient given informed consent, signed copy in the chart. Placed in lithotomy position. Cervix viewed with speculum and colposcope. Was the entire squamocolumnar junction seen?yes Any acetowhite lesions noted?yes 1:00 did not extend into canal Any abnormalities seen with green filter?no Any abnormalities seen with application of Lugol's solution?same as with acetowhite Was the endocervical canal sampled?for pap only Were any cervical biopsies taken?yes 11:00 Were there any complications?no COMMENTS: Patient was given post procedure instructions. We will notify her of any results.    Impression & Recommendations:  Problem # 1:  ASCUS PAP (ICD-795.01)  ASCUS_H pap colpo with biopsy phone [phone]  Orders: Colposcopy w/ biopsy [facility] (45409)  Complete Medication  List: 1)  Proventil Hfa 108 (90 Base) Mcg/act Aers (Albuterol sulfate) .... Take 1 puff up to every 4 hours as needed for wheezing or lung tightness  Appended Document: colposcopy/tlb(resch'd from 10/6)/bmc   Appended Document: Orders Update-HPV charge     Clinical Lists Changes  Orders: Added new Test order of HPV Typing-[facility] [phone]) - Signed

## 2010-12-06 NOTE — Assessment & Plan Note (Signed)
Summary: Sore/painful throat/strother/kf   Vital Signs:  Patient profile:   30 year old female Height:      66 inches Weight:      305 pounds BMI:     49.41 Temp:     98 degrees F Pulse rate:   76 / minute BP sitting:   149 / 88  (right arm) Cuff size:   regular  Vitals Entered By: Dennison Nancy RN (September 15, 2010 2:28 PM) CC: [place] for sore throat Is Patient Diabetic? No Pain Assessment Patient in pain? yes     Location: throat Intensity: 6   Primary Provider:  Jamie Brookes MD  CC:  [place] for sore throat.  History of Present Illness: Ms. Monica Foster comes in complaining of a sore throat and not feeling well for about one day.  She says it hurts to swallow.  She says she has had a runny nose but no cough. No fever or chills.  She has felt a little tired, but has not lost her appetite.  ROS: Negative except HPI.   Habits & Providers  Alcohol-Tobacco-Diet     Tobacco Status: quit     Cigarette Packs/Day: n/a  Current Medications (verified): 1)  Proventil Hfa 108 (90 Base) Mcg/act Aers (Albuterol Sulfate) .... Take 1 Puff Up To Every 4 Hours As Needed For Wheezing or Lung Tightness  Allergies (verified): No Known Drug Allergies  Physical Exam  General:  Well-developed,well-nourished,in no acute distress; alert,appropriate and cooperative throughout examination Head:  Normocephalic Eyes:  PERRL, EOMIT Ears:  External ear exam shows no significant lesions or deformities.  Otoscopic examination reveals clear canals, tympanic membranes are intact bilaterally without bulging, retraction, inflammation or discharge. Hearing is grossly normal bilaterally. Nose:  Clear rhinorrhea Mouth:  Oral mucosa and oropharynx clear nasal drip, no puss exudates.  Teeth in good repair. Neck:  No deformities, masses, or tenderness noted. Lungs:  Normal respiratory effort, chest expands symmetrically. Lungs are clear to auscultation, no crackles or wheezes. Heart:  Normal rate and regular  rhythm. S1 and S2 normal without gallop, murmur, click, rub or other extra sounds. Abdomen:  Bowel sounds positive,abdomen soft and non-tender without masses, organomegaly or hernias noted.   Impression & Recommendations:  Problem # 1:  SORE THROAT (ICD-462) Rapid strep negative and pt. does not have swollen tonsills or erythema. Pt most likely has URI, causing post nasal drip and sore throat.  Advised she could take tylenol or an over the counter cough syrup for the sore throat.  Orders: Rapid Strep-[facility] ([address]) [facility]- Est Level  3 (60454)  Problem # 2:  ALLERGIC  RHINITIS (ICD-477.9) Pt also has allergies that could be contributing to her rhinorrhea. Would consider starting nasal steroids or zyrtec it her symptoms do not resolve within a week.  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  Her updated medication list for this problem includes:    Proventil Hfa 108 (90 Base) Mcg/act Aers (Albuterol sulfate) .Marland Kitchen... Take 1 puff up to every 4 hours as needed for wheezing or lung tightness  Orders: [facility]- Est Level  3 (99213)  Complete Medication List: 1)  Proventil Hfa 108 (90 Base) Mcg/act Aers (Albuterol sulfate) .... Take 1 puff up to every 4 hours as needed for wheezing or lung tightness   Orders Added: 1)  Rapid Strep-[facility] [87430] 2)  [facility]- Est Level  3 [  [address]]    Laboratory Results  Date/Time Received: September 15, 2010 2:20 PM  Date/Time Reported: September 15, 2010 2:37 PM   Other Tests  Rapid Strep: negative Comments: ...........test performed by...........Marland KitchenTerese Door, CMA

## 2010-12-06 NOTE — Progress Notes (Signed)
Summary: phone note/results   Phone Note Outgoing Call   Call placed by: Loralee Pacas CMA,  July 27, 2010 2:44 PM Summary of Call: called to inform pt of negative test results  Follow-up for Phone Call        spoke with pt, informed of neg test results. Follow-up by: Tessie Fass CMA,  July 27, 2010 3:19 PM

## 2010-12-06 NOTE — Letter (Signed)
Summary: COLPO Letter  [facility]  [address]   [place], [place] [address]   Phone: [phone]  Fax: [phone]    08/23/2010  Monica Foster [address] [place], [place]  [address]  Dear Ms. Agro,  Your cervical biopsy showed the same mildly abnormal cells we saw on your pap smear. I would recommend we repeat your colposcopy in 6 months. Please scheduel a colposcopy in April of 2012 with me. Please call me with questions.         Sincerely,   Denny Levy MD  Appended Document: COLPO Letter mailed

## 2010-12-08 ENCOUNTER — Ambulatory Visit (INDEPENDENT_AMBULATORY_CARE_PROVIDER_SITE_OTHER): Payer: Self-pay | Admitting: Family Medicine

## 2010-12-08 ENCOUNTER — Encounter: Payer: Self-pay | Admitting: Family Medicine

## 2010-12-08 DIAGNOSIS — IMO0002 Reserved for concepts with insufficient information to code with codable children: Secondary | ICD-10-CM

## 2010-12-08 NOTE — Miscellaneous (Signed)
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

## 2010-12-08 NOTE — Letter (Signed)
Summary: Work Excuse  [facility]  [address]   [place], [place] [address]   Phone: [phone]  Fax: [phone]    Today's Date: November 23, 2010  Name of Patient: Monica Foster [facility])  The above named patient had a medical visit today at:  11:00 am.  Please take this into consideration when reviewing the time away from workl.    Special Instructions:  [  ] None  [x ] To be off the remainder of today, returning to the normal work 11/25/2010.  [  ] To be off until the next scheduled appointment on ______________________.  [  ] Other ________________________________________________________________ ________________________________________________________________________   Sincerely yours,   Loralee Pacas CMA

## 2010-12-14 NOTE — Assessment & Plan Note (Signed)
Vital Signs:  Patient profile:   30 year old female Weight:      301 pounds Temp:     98.2 degrees F BP sitting:   126 / 89  Primary Care Provider:  Jamie Brookes MD  CC:  back strain.  History of Present Illness: seen in [facility] after straining a muscle in back at work.  told to use motrin for pain.  feeling better, but arm still feels heavy and some pain in back with movt.  no numbness, tingling.  Current Medications (verified): 1)  Proventil Hfa 108 (90 Base) Mcg/act Aers (Albuterol Sulfate) .... Take 1 Puff Up To Every 4 Hours As Needed For Wheezing or Lung Tightness 2)  Flexeril 5 Mg Tabs (Cyclobenzaprine Hcl) .... Take Up To Three Times A Day As Needed Muscle Spasm  Allergies (verified): No Known Drug Allergies  Review of Systems  The patient denies anorexia, fever, and weight loss.    Physical Exam  General:  Well-developed,well-nourished,in no acute distress; alert,appropriate and cooperative throughout examination Msk:  Shoulder: Inspection reveals no abnormalities, atrophy or asymmetry. Palpation is normal with no tenderness over AC joint or bicipital groove. ROM is full in all planes. Rotator cuff strength normal throughout. No signs of impingement with negative Neer and Hawkin's tests, empty can. Speeds and Yergason's tests normal. No labral pathology noted with negative Obrien's, negative clunk and good stability. Normal scapular function observed. No painful arc and no drop arm sign. No apprehension sign   no pain on palpation of spinous processes focal tenderness over muscle paraspinous muscle near scapula   Impression & Recommendations:  Problem # 1:  BACK STRAIN (ICD-847.9) Assessment New strain and muscle spasm.  improving.  adding flexiril to help with spasm.  see instructions for details. Orders: [facility]- Est Level  3 (99213)  Complete Medication List: 1)  Proventil Hfa 108 (90 Base) Mcg/act Aers (Albuterol sulfate) .... Take 1 puff up to every 4  hours as needed for wheezing or lung tightness 2)  Flexeril 5 Mg Tabs (Cyclobenzaprine hcl) .... Take up to three times a day as needed muscle spasm  Patient Instructions: 1)  I think you have a strained muscle in your back 2)  please try the flexiril for spasm and advil for pain 3)  I would also try heat and ice  4)  try to massage with a tennis ball like we discussed Prescriptions: FLEXERIL 5 MG TABS (CYCLOBENZAPRINE HCL) take up to three times a day as needed muscle spasm  #20 x 1   Entered and Authorized by:   Ellery Plunk MD   Signed by:   Ellery Plunk MD on 12/08/2010   Method used:   Electronically to        [facility] [phone]* (retail)       [address]       [place], [place]  [address]       Ph: [phone]       Fax: [phone]   RxID:   [id number] FLEXERIL 5 MG TABS (CYCLOBENZAPRINE HCL) take up to three times a day as needed muscle spasm  #20 x 1   Entered and Authorized by:   Ellery Plunk MD   Signed by:   Ellery Plunk MD on 12/08/2010   Method used:   Electronically to        [facility]* (retail)       [address]       [place]  [place], [place]  [address]       Ph: [phone]       Fax: [phone]   RxID:   [id number]    Orders Added: 1)  [facility]- Est Level  3 [84132]

## 2011-02-14 LAB — CBC
HCT: 36.7 % (ref 36.0–46.0)
Hemoglobin: 11.2 g/dL — ABNORMAL LOW (ref 12.0–15.0)
Hemoglobin: 12.5 g/dL (ref 12.0–15.0)
MCHC: 34.6 g/dL (ref 30.0–36.0)
MCV: 84.5 fL (ref 78.0–100.0)
MCV: 85 fL (ref 78.0–100.0)
Platelets: 208 10*3/uL (ref 150–400)
RDW: 14.3 % (ref 11.5–15.5)
RDW: 14.6 % (ref 11.5–15.5)

## 2011-03-03 ENCOUNTER — Inpatient Hospital Stay (INDEPENDENT_AMBULATORY_CARE_PROVIDER_SITE_OTHER)
Admission: RE | Admit: 2011-03-03 | Discharge: 2011-03-03 | Disposition: A | Discharge status: Home / Self Care | Payer: Self-pay | Source: Ambulatory Visit | Attending: Family Medicine | Admitting: Family Medicine

## 2011-03-03 DIAGNOSIS — J069 Acute upper respiratory infection, unspecified: Secondary | ICD-10-CM

## 2011-03-19 ENCOUNTER — Telehealth: Payer: Self-pay | Admitting: Family Medicine

## 2011-03-19 NOTE — Telephone Encounter (Signed)
Chest pain when she eats for past 2 days.  Feels pain in middle, described as soreness, not tender to palpation today, but tender to palpation yesterday.  No shortness of breath or palpitations, non-radiating.  Lasts 15-20 minutes after eating or drinking and ten resolves.  Takes Prilosec 1 tab intermittently for GERD, has taken it for past 2 days without relief.  Has tried taking 1 dose of baking soda and 2 pills of Tums yesterday with minimal relief.   Likely not cardiac pain.  Recommended to continue Tums for immediate relief, increase from 20 to 40 mg Prilosec.  Also discussed acid rebound after stopping Prilosec, she may need taper to H2 blocker versus daily PPI for 6 weeks rather than intermittent dosing.  She is to call and make appt tomorrow to see PCP or another physician at [facility].

## 2011-03-21 NOTE — Op Note (Signed)
Monica Foster, Monica Foster           ACCOUNT NO.:  [id number]   MEDICAL RECORD NO.:  [id number]          PATIENT TYPE:  INP   LOCATION:  9140                          FACILITY:  [facility]   PHYSICIAN:  Scheryl Darter, MD       DATE OF BIRTH:  [birth year]   DATE OF PROCEDURE:  03/26/2009  DATE OF DISCHARGE:                               OPERATIVE REPORT   PREOPERATIVE DIAGNOSES:  1. Multiparity.  2. Undesired fertility.   POSTOPERATIVE DIAGNOSES:  1. Multiparity.  2. Undesired fertility.   PROCEDURE:  Postpartum bilateral tubal sterilization procedure with  Filshie clips.   SURGEON:  Scheryl Darter, MD   ASSISTANT:  Odie Sera, DO   ANESTHESIA:  Epidural and local.   INDICATIONS FOR PROCEDURE:  [name] is a 30 year old now  gravida 3, para 3, who is now status post normal spontaneous vaginal  delivery.  She has previously requested a bilateral tubal ligation.  Her  Medicaid papers on her chart.  She was counseled on the risks and  benefits of this procedure to include, but not limited to bleeding,  infection, damage to internal organs, as well as the failure rate to 1-2  and per 100 with an increased risk of ectopic pregnancy should failure  occur.  The patient is in agreement with this understanding of these  risks and desires to pre proceed with the surgery.   DESCRIPTION OF PROCEDURE:  The patient was taken to the operating room,  where her epidural anesthesia was rebolus.  She was then prepped and  draped in the usual sterile manner.  Time-out was conducted.  A 2-cm  transverse incision was made in the inferior umbilical area.  The  incision was extended down through the fascia.  The fascia was then  incised with Mayo scissors and extended laterally using Mayo scissors.  The peritoneum was then entered bluntly and using Army-Navy retractors,  the left fallopian tube was identified and grasped with a Babcock clamp.  The fallopian tube was traced distally to the  fimbriated area and then  back proximally and a Filshie clip was placed approximately 2 cm from  the cornu of the uterus.  The tube was then returned to the abdomen.  The right fallopian tube was identified and grasped with Babcock clamp,  it was traced distally to identification of the fimbriated end, then  traced proximally and a Filshie clip was placed approximately 2 cm from  the cornu of the uterus.  The tube was then returned to the abdomen.  The fascia was then closed using 0 Vicryl in a running non interlocking  manner.  The skin was then closed using 4-0 Vicryl in a subcuticular  fashion.  The superior and inferior aspects of the skin were then  anesthetized with 10 mL total of 0.25% Marcaine solution.  All sponge,  instrument, and needle counts were correct x2.   FINDINGS:  Bilateral tubes and ovaries grossly normal.  Estimated blood  loss was minimal.  There are no immediate complications.  The patient  was taken to PACU in good condition.  Odie Sera, DO      Scheryl Darter, MD  Electronically Signed    MC/MEDQ  D:  03/26/2009  T:  03/27/2009  Job:  [id number]

## 2011-05-31 ENCOUNTER — Emergency Department (HOSPITAL_COMMUNITY)
Admission: EM | Admit: 2011-05-31 | Discharge: 2011-06-01 | Disposition: A | Discharge status: Home / Self Care | Payer: Self-pay | Attending: Emergency Medicine | Admitting: Emergency Medicine

## 2011-05-31 DIAGNOSIS — M25559 Pain in unspecified hip: Secondary | ICD-10-CM | POA: Insufficient documentation

## 2011-05-31 DIAGNOSIS — J45909 Unspecified asthma, uncomplicated: Secondary | ICD-10-CM | POA: Insufficient documentation

## 2011-05-31 DIAGNOSIS — IMO0002 Reserved for concepts with insufficient information to code with codable children: Secondary | ICD-10-CM | POA: Insufficient documentation

## 2011-05-31 DIAGNOSIS — X58XXXA Exposure to other specified factors, initial encounter: Secondary | ICD-10-CM | POA: Insufficient documentation

## 2011-05-31 DIAGNOSIS — M79609 Pain in unspecified limb: Secondary | ICD-10-CM | POA: Insufficient documentation

## 2011-05-31 DIAGNOSIS — K219 Gastro-esophageal reflux disease without esophagitis: Secondary | ICD-10-CM | POA: Insufficient documentation

## 2011-06-30 ENCOUNTER — Other Ambulatory Visit: Payer: Self-pay

## 2011-06-30 ENCOUNTER — Ambulatory Visit (HOSPITAL_COMMUNITY)
Admission: RE | Admit: 2011-06-30 | Discharge: 2011-06-30 | Disposition: A | Discharge status: Home / Self Care | Payer: Self-pay | Source: Ambulatory Visit | Attending: Family Medicine | Admitting: Family Medicine

## 2011-06-30 ENCOUNTER — Ambulatory Visit (INDEPENDENT_AMBULATORY_CARE_PROVIDER_SITE_OTHER): Payer: Self-pay | Admitting: Family Medicine

## 2011-06-30 VITALS — BP 107/73 | HR 82 | Temp 98.0°F | Wt 317.0 lb

## 2011-06-30 DIAGNOSIS — R079 Chest pain, unspecified: Secondary | ICD-10-CM

## 2011-06-30 DIAGNOSIS — IMO0002 Reserved for concepts with insufficient information to code with codable children: Secondary | ICD-10-CM

## 2011-06-30 DIAGNOSIS — M542 Cervicalgia: Secondary | ICD-10-CM | POA: Insufficient documentation

## 2011-06-30 DIAGNOSIS — M79609 Pain in unspecified limb: Secondary | ICD-10-CM | POA: Insufficient documentation

## 2011-06-30 DIAGNOSIS — H538 Other visual disturbances: Secondary | ICD-10-CM

## 2011-06-30 DIAGNOSIS — M4000 Postural kyphosis, site unspecified: Secondary | ICD-10-CM | POA: Insufficient documentation

## 2011-06-30 LAB — GLUCOSE, CAPILLARY: Glucose-Capillary: 84 mg/dL (ref 70–99)

## 2011-06-30 MED ORDER — TRAMADOL HCL 50 MG PO TABS
50.0000 mg | ORAL_TABLET | Freq: Four times a day (QID) | ORAL | Status: DC | PRN
Start: 2011-06-30 — End: 2011-06-30

## 2011-06-30 MED ORDER — TRAMADOL HCL 50 MG PO TABS: 50.0000 mg | ORAL_TABLET | Freq: Four times a day (QID) | ORAL | Status: DC | PRN

## 2011-06-30 NOTE — Patient Instructions (Signed)
I am sending in your tramadol.  You can take this with tylenol as well  Please go over to [facility] for your spine x ray. You go to the radiology dept   Please make an appt to see Dr. Berline Chough to talk about your neck pain

## 2011-06-30 NOTE — Assessment & Plan Note (Signed)
Unlikely to be PE, MI, PNA or pneumothorax based on age and symptoms, normal EKG.  Will send for C spine films and f/u with PCP.  Will give tramadol as flexiril makes her too sleepy

## 2011-07-03 NOTE — Progress Notes (Signed)
  Subjective:    Patient ID: Monica Foster, female    DOB: 06/09/1981, 30 y.o.   MRN: [id number]  HPI Pt presents with blurry vision x 2 days and increasing left sided neck pain since this morning that runs from her ear to her clavicle.  She had one hour of chest tightness this morning.  She also has some tingling sensation that shoots to her elbow but not to her fingers.  There was no preceding injury.  She had similar symptoms on the right side in January that come and go.  She has flexiril for this but cannot take it at work.  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 s he thinks this aggravates her neck.   She reports that taking a deep breath hurts in a spot on her left side  Review of Systems Denies N/V/D, fever     Objective:   Physical Exam Vital signs reviewed General appearance - alert, well appearing, and in no distress and oriented to person, place, and time Heart - normal rate, regular rhythm, normal S1, S2, no murmurs, rubs, clicks or gallops Chest - clear to auscultation, no wheezes, rales or rhonchi, symmetric air entry, no tachypnea, retractions or cyanosis MSK- fullness of the SCM muscle from below ear to chest, pt able to move neck and has full strength and ROM.  Shoulders with full ROM.  Full strength and normal DTR bilateral upper ext.   HEENT- eyes with normal vision, no injection, PERRL, EOMI.  Ears with normal TMs       Assessment & Plan:  BACK STRAIN Unlikely to be PE, MI, PNA or pneumothorax based on age and symptoms, normal EKG.  Will send for C spine films and f/u with PCP.  Will give tramadol as flexiril makes her too sleepy

## 2011-10-11 ENCOUNTER — Encounter: Payer: Self-pay | Admitting: Emergency Medicine

## 2011-10-11 ENCOUNTER — Telehealth: Payer: Self-pay | Admitting: Family Medicine

## 2011-10-11 ENCOUNTER — Emergency Department (HOSPITAL_COMMUNITY)
Admission: EM | Admit: 2011-10-11 | Discharge: 2011-10-12 | Disposition: A | Discharge status: Home / Self Care | Payer: Self-pay | Attending: Emergency Medicine | Admitting: Emergency Medicine

## 2011-10-11 ENCOUNTER — Emergency Department (HOSPITAL_COMMUNITY): Payer: Self-pay

## 2011-10-11 DIAGNOSIS — R0602 Shortness of breath: Secondary | ICD-10-CM | POA: Insufficient documentation

## 2011-10-11 DIAGNOSIS — IMO0001 Reserved for inherently not codable concepts without codable children: Secondary | ICD-10-CM | POA: Insufficient documentation

## 2011-10-11 DIAGNOSIS — R51 Headache: Secondary | ICD-10-CM | POA: Insufficient documentation

## 2011-10-11 DIAGNOSIS — R509 Fever, unspecified: Secondary | ICD-10-CM | POA: Insufficient documentation

## 2011-10-11 DIAGNOSIS — J45909 Unspecified asthma, uncomplicated: Secondary | ICD-10-CM | POA: Insufficient documentation

## 2011-10-11 DIAGNOSIS — R61 Generalized hyperhidrosis: Secondary | ICD-10-CM | POA: Insufficient documentation

## 2011-10-11 DIAGNOSIS — R197 Diarrhea, unspecified: Secondary | ICD-10-CM | POA: Insufficient documentation

## 2011-10-11 DIAGNOSIS — J3489 Other specified disorders of nose and nasal sinuses: Secondary | ICD-10-CM | POA: Insufficient documentation

## 2011-10-11 DIAGNOSIS — R070 Pain in throat: Secondary | ICD-10-CM | POA: Insufficient documentation

## 2011-10-11 DIAGNOSIS — R059 Cough, unspecified: Secondary | ICD-10-CM | POA: Insufficient documentation

## 2011-10-11 DIAGNOSIS — R05 Cough: Secondary | ICD-10-CM | POA: Insufficient documentation

## 2011-10-11 DIAGNOSIS — K219 Gastro-esophageal reflux disease without esophagitis: Secondary | ICD-10-CM | POA: Insufficient documentation

## 2011-10-11 DIAGNOSIS — J111 Influenza due to unidentified influenza virus with other respiratory manifestations: Secondary | ICD-10-CM | POA: Insufficient documentation

## 2011-10-11 DIAGNOSIS — R112 Nausea with vomiting, unspecified: Secondary | ICD-10-CM | POA: Insufficient documentation

## 2011-10-11 HISTORY — DX: Obesity, unspecified: E66.9

## 2011-10-11 HISTORY — DX: Gastro-esophageal reflux disease without esophagitis: K21.9

## 2011-10-11 MED ORDER — OXYCODONE-ACETAMINOPHEN 5-325 MG PO TABS
1.0000 | ORAL_TABLET | Freq: Once | ORAL | Status: AC
  Administered 2011-10-11: 1 via ORAL
  Filled 2011-10-11: qty 1

## 2011-10-11 MED ORDER — ALBUTEROL SULFATE HFA 108 (90 BASE) MCG/ACT IN AERS: 2.0000 | INHALATION_SPRAY | RESPIRATORY_TRACT | Status: DC | PRN

## 2011-10-11 MED ORDER — ALBUTEROL SULFATE (5 MG/ML) 0.5% IN NEBU
2.5000 mg | INHALATION_SOLUTION | Freq: Once | RESPIRATORY_TRACT | Status: AC
  Administered 2011-10-11: 2.5 mg via RESPIRATORY_TRACT
  Filled 2011-10-11: qty 0.5

## 2011-10-11 MED ORDER — IPRATROPIUM BROMIDE 0.02 % IN SOLN
0.5000 mg | Freq: Once | RESPIRATORY_TRACT | Status: AC
  Administered 2011-10-11: 0.5 mg via RESPIRATORY_TRACT
  Filled 2011-10-11: qty 2.5

## 2011-10-11 MED ORDER — ONDANSETRON 4 MG PO TBDP
8.0000 mg | ORAL_TABLET | Freq: Once | ORAL | Status: AC
  Administered 2011-10-11: 8 mg via ORAL
  Filled 2011-10-11: qty 2

## 2011-10-11 NOTE — Telephone Encounter (Signed)
Called to discuss her breathing. She has the flu and has noted wheezing and difficulty breathing recently. She has a diagnosis of asthma and does not have albuterol.   I recommended that she present to the ED for evaluation and management. Will also refill albuterol in case she gets discharged.

## 2011-10-11 NOTE — ED Notes (Signed)
Pt presents w/ multiple complaints - states she has been having cold-like symptoms, non-productive cough, chills body aches, and n/v that began approx 3 days ago - pt son recently had the flu. Pt w/ hx of asthma and states she does not currently have an inhaler for at home use. Pt resting comfortably on bed in no acute distress, family at bedside.

## 2011-10-11 NOTE — ED Notes (Signed)
PT. REPORTS SOB WITH DRY COUGH ONSET YESTERDAY , RUNNY NOSE /NASAL CONGESTION , HEADACHE , VOMITTING AND DIARRHEA.

## 2011-10-11 NOTE — ED Provider Notes (Addendum)
History     CSN: [id number] Arrival date & time: 10/11/2011  7:47 PM   First MD Initiated Contact with Patient 10/11/11 2240      Chief Complaint  Patient presents with  . Shortness of Breath    (Consider location/radiation/quality/duration/timing/severity/associated sxs/prior treatment) Patient is a 30 y.o. female presenting with shortness of breath. The history is provided by the patient.  Shortness of Breath  Associated symptoms include shortness of breath.  She has been sick for the last 2 days. She has 2 children who had influenza and was exposed to it 5 days ago. Her symptoms have included a subjective fever, chills, sweats, rhinorrhea, headache, sore throat, nonproductive cough, nausea, vomiting, diarrhea, and myalgias. She rates her pain at 10 out of 10. Vomiting has been mainly post tussive. She has not taken any medication to try and help her symptoms. Symptoms are described as severe.  Past Medical History  Diagnosis Date  . Asthma   . GERD (gastroesophageal reflux disease)   . Obesity     History reviewed. No pertinent past surgical history.  No family history on file.  History  Substance Use Topics  . Smoking status: Never Smoker   . Smokeless tobacco: Not on file  . Alcohol Use: No    OB History    Grav Para Term Preterm Abortions TAB SAB Ect Mult Living                  Review of Systems  Respiratory: Positive for shortness of breath.   All other systems reviewed and are negative.    Allergies  Review of patient's allergies indicates no known allergies.  Home Medications   Current Outpatient Rx  Name Route Sig Dispense Refill  . ALBUTEROL SULFATE HFA 108 (90 BASE) MCG/ACT IN AERS Inhalation Inhale 2 puffs into the lungs every 4 (four) hours as needed. For wheezing.     Marland Kitchen TRAMADOL HCL 50 MG PO TABS Oral Take 50 mg by mouth every 6 (six) hours as needed. For pain.       BP 122/65  Pulse 85  Temp(Src) 98.2 F (36.8 C) (Oral)  Resp 22  SpO2  98%  LMP 09/08/2011  Physical Exam  Nursing note and vitals reviewed.  30 year old female who does appear uncomfortable. Vital signs are significant for mild tachypnea with respiratory rate of 22. Head is normocephalic and atraumatic. PERRLA, EOMI. There is no sinus tenderness oropharynx is clear. Neck is supple without adenopathy. Lungs have slight prolongation of the exhalation phase with slight wheezing noted. There no rales or rhonchi. Heart has regular rate and rhythm without murmur. Abdomen is obese, soft, nontender without masses or hepatosplenomegaly. Peristalsis is diminished. Extremities have no cyanosis or edema, full range of motion present. Skin is warm and moist without rashes. Neurologic: Mental status is normal, cranial nerves are intact, there are no motor or sensory deficits. Psychiatric: No abnormalities of mood or affect.  ED Course  Procedures (including critical care time)  Labs Reviewed - No data to display Dg Chest 2 View  10/11/2011  *RADIOLOGY REPORT*  Clinical Data: Shortness of breath and cough.  CHEST - 2 VIEW  Comparison: Chest and rib films 12/06/2010.  Findings: The heart size is normal.  The lung volumes are low. Mild bibasilar airspace disease likely reflects atelectasis.  The visualized soft tissues and bony thorax are unremarkable.  IMPRESSION:  1.  Low lung volumes and mild bibasilar atelectasis.  This likely reflects atelectasis. 2.  No other  significant airspace consolidation.  Original Report Authenticated By: Jamesetta Orleans. MATTERN, M.D.     No diagnosis found. Results for orders placed in visit on 06/30/11  GLUCOSE, CAPILLARY      Component Value Range   Glucose-Capillary 84  70 - 99 (mg/dL)   Dg Chest 2 View  16/11/958  *RADIOLOGY REPORT*  Clinical Data: Shortness of breath and cough.  CHEST - 2 VIEW  Comparison: Chest and rib films 12/06/2010.  Findings: The heart size is normal.  The lung volumes are low. Mild bibasilar airspace disease likely  reflects atelectasis.  The visualized soft tissues and bony thorax are unremarkable.  IMPRESSION:  1.  Low lung volumes and mild bibasilar atelectasis.  This likely reflects atelectasis. 2.  No other significant airspace consolidation.  Original Report Authenticated By: Jamesetta Orleans. MATTERN, M.D.   She was given an Albuterol nebulizer treatment with significant subjective improvement./  MDM  Clinical influenza. She is beyond the 48 hour window to start antivirals. She will be treated symptomatically.        Dione Booze, MD 10/12/11 1112  Dione Booze, MD 10/12/11 [phone]

## 2011-10-12 MED ORDER — METOCLOPRAMIDE HCL 10 MG PO TABS: 10.0000 mg | ORAL_TABLET | Freq: Four times a day (QID) | ORAL | Status: AC | PRN

## 2011-10-12 MED ORDER — ALBUTEROL SULFATE HFA 108 (90 BASE) MCG/ACT IN AERS
2.0000 | INHALATION_SPRAY | RESPIRATORY_TRACT | Status: DC | PRN
  Administered 2011-10-12: 2 via RESPIRATORY_TRACT
  Filled 2011-10-12: qty 6.7

## 2011-10-12 MED ORDER — OXYCODONE-ACETAMINOPHEN 5-325 MG PO TABS: 1.0000 | ORAL_TABLET | ORAL | Status: AC | PRN

## 2011-12-08 ENCOUNTER — Encounter: Payer: Self-pay | Admitting: *Deleted

## 2011-12-21 ENCOUNTER — Encounter: Payer: Self-pay | Admitting: Family Medicine

## 2011-12-21 ENCOUNTER — Ambulatory Visit (INDEPENDENT_AMBULATORY_CARE_PROVIDER_SITE_OTHER): Payer: Self-pay | Admitting: Family Medicine

## 2011-12-21 DIAGNOSIS — M2140 Flat foot [pes planus] (acquired), unspecified foot: Secondary | ICD-10-CM | POA: Insufficient documentation

## 2011-12-21 DIAGNOSIS — M999 Biomechanical lesion, unspecified: Secondary | ICD-10-CM | POA: Insufficient documentation

## 2011-12-21 DIAGNOSIS — IMO0002 Reserved for concepts with insufficient information to code with codable children: Secondary | ICD-10-CM

## 2011-12-21 NOTE — Assessment & Plan Note (Signed)
After verbal consent pt did have HVLA, with marked improvement.  Gave side effects to look out for and can take anti inflammatories in the acute time frame. Given exercises at home will followup with primary care provider for more manipulation.

## 2011-12-21 NOTE — Assessment & Plan Note (Signed)
Secondary to patient's obesity and lack of exercise and movement. Patient also has pes planus which I think is exacerbating the situation. Patient did have manipulation therapy today with improvement. Patient given exercises to do at home will followup with primary care provider who also does nutrition therapy and might be useful.

## 2011-12-21 NOTE — Progress Notes (Signed)
  Subjective:    Patient ID: Monica Foster, female    DOB: 1981-08-07, 31 y.o.   MRN: [id number]  HPI 31 year old female coming in with back pain. Patient states that she usually has a right cervical right upper back pain as well as a lower back pain. Patient has had these pains intermittently for some time. Patient states his last flare has occurred during the last 3 days. Patient denies any exacerbating injury or any new activity. Patient has not gained weight loss weight denies any dysuria hematuria bowel or bladder incontinence or radiation of pain to the extremities. Patient states though she does work on her feet all day and because of this she has increasing pain. Patient pain was so bad that she was unable to work today.   Review of Systems As stated in the history of present illness patient also denies fevers or chills    Objective:   Physical Exam General: No apparent distress mildly obese female Muscle skeletal exam: Foot exam shows severe pes planus bilaterally Back exam shows trigger points within the right trapezius muscle patient also has various muscle spasms OMT Findings: Cervical: C3 rotated and side bent left Thoracic T5 extended rotated and side bent right Lumbar: L2 flexed rotated and side bent left Sacrum: Left on left Anterior right ilium        Assessment & Plan:

## 2011-12-21 NOTE — Patient Instructions (Signed)
Printed exercises for patient to follow.

## 2011-12-23 ENCOUNTER — Telehealth: Payer: Self-pay | Admitting: Family Medicine

## 2011-12-23 NOTE — Telephone Encounter (Addendum)
Mother called back about her daughter Monica Foster. Monica Foster regarding persistent vomiting.  Diagnosed with GI virus in ED last Thursday.  Mother calling me to let me know that she is on her way to ED.

## 2011-12-26 ENCOUNTER — Ambulatory Visit (INDEPENDENT_AMBULATORY_CARE_PROVIDER_SITE_OTHER): Payer: Self-pay | Admitting: Family Medicine

## 2011-12-26 VITALS — BP 118/80 | HR 76 | Temp 98.6°F | Ht 64.5 in | Wt 320.0 lb

## 2011-12-26 DIAGNOSIS — M999 Biomechanical lesion, unspecified: Secondary | ICD-10-CM

## 2011-12-26 MED ORDER — RANITIDINE HCL 300 MG PO TABS: 300.0000 mg | ORAL_TABLET | Freq: Every day | ORAL | Status: DC

## 2011-12-26 MED ORDER — MELOXICAM 15 MG PO TABS: 15.0000 mg | ORAL_TABLET | Freq: Every day | ORAL | Status: AC

## 2011-12-26 MED ORDER — KETOROLAC TROMETHAMINE 60 MG/2ML IM SOLN
60.0000 mg | Freq: Once | INTRAMUSCULAR | Status: AC
  Administered 2011-12-26: 60 mg via INTRAMUSCULAR

## 2011-12-26 MED ORDER — METHYLPREDNISOLONE ACETATE 80 MG/ML IJ SUSP
80.0000 mg | Freq: Once | INTRAMUSCULAR | Status: AC
  Administered 2011-12-26: 80 mg via INTRAMUSCULAR

## 2011-12-26 NOTE — Patient Instructions (Addendum)
Monica Foster,  Thank you for coming in to see me today. You are having muscular low back pain associated with spasms.   For your back pain: please take the mobic once a day, safe to take with tamadol. Also take the zantac to prevent worsening reflux while taking the mobic.  In addition: ice your back for the first 24 hrs or so (20 minutes every 4-6 hrs), then start with heat and the exercises Dr. Katrinka Blazing gave you last week.   Tart Cherry Juice is a natural anti-inflammatory: try finding it at [employer], [place], [employer], [employer].  Regular follow-up with your PCP is important make an appointment for 3-4 weeks.  Weight loss is also a key part of treatment and prevention of low back pain. So try initiating a diet now, then add exercise once your pain is better controlled.   Dr. Armen Pickup

## 2011-12-26 NOTE — Assessment & Plan Note (Signed)
A: Low back pain in the setting of morbid obesity no red flags to suggest malignancy or fracture. P: -In office: Toradol 60 mg IM.  Depo-Medrol 80 mg IM/ -Ice x24 hours then heat -Mobic 15 mg daily -Zantac 300 mg daily to 10 mg daily to prevent exacerbation of GERD in the setting of NSAID use -Patient advised continue back exercises per previous instructions also patient advised to drink her cherry juice as a natural inflammatory. -regular followup in 2-3 weeks with her PCP recommend checking creatinine patient has not had blood work in over year. -Discussed patient's the importance of weight loss for admission treatment of low back pain advise first initiate diet and then incorporating daily exercise.  -Patient provided with note for work.

## 2011-12-26 NOTE — Progress Notes (Signed)
Subjective:     Patient ID: Monica Foster, female   DOB: 1981/08/25, 31 y.o.   MRN: [id number]  HPI 31 year old female presents with complaint of worsening low back pain. She was seen for this problem about 5 days ago by Dr. Terrilee Files.  The pain is present now for 8 days. She has a history of low back pain but states this pain is worse than than usual. She describes 9/10 bilateral low back pain is nonradiating. She denies associated symptoms of fever, chills, fecal or urinary incontinence. She denies recent trauma or falls. She denies weight gain or weight loss. She has tried the tramadol with Tylenol and heat with minimal relief of pain. She works in [employer] and stands for many hours a day this is by the end of the day her pain is severe.  She has family history of arthritis and many older relatives.  Review of Systems  history of reflux has taken a PPI in the past.     Objective:   Physical Exam BP 118/80  Pulse 76  Temp(Src) 98.6 F (37 C) (Oral)  Ht 5' 4.5" (1.638 m)  Wt 320 lb (145.151 kg)  BMI 54.08 kg/m2  LMP 12/15/2011 General appearance: alert, cooperative and morbidly obese Back: Paraspinal pain starting from L2-L5. Full range of motion. No spinous process tenderness or pain. No gluteal pain.  Negative sitting and lying straight and cross straight leg raising test. Negative FABER bilaterally. Strength is 5 out of 5 in bilateral hips. Gait is slow but normal. 2+ PT reflexes bilaterally.      Assessment:       Plan:

## 2012-04-09 ENCOUNTER — Encounter: Payer: Self-pay | Admitting: *Deleted

## 2013-06-12 ENCOUNTER — Telehealth: Payer: Self-pay | Admitting: Sports Medicine

## 2013-06-12 NOTE — Telephone Encounter (Signed)
Health Exam Form for job w/ [employer] to be completed by Berline Chough.

## 2013-06-13 NOTE — Telephone Encounter (Signed)
Monica Foster has not been seen in our office since 12/2011.  I informed her that she will need an appointment before form can be completed. She has an appointment with Britta Mccreedy on 06/25/13 to apply for orange card.  Appointment scheduled with Dr. Berline Chough 06/27/2013 @ 8:45am to complete Health Examination Certificate for [facility] for [employer].  [name]

## 2013-06-25 ENCOUNTER — Ambulatory Visit: Payer: Self-pay

## 2013-06-27 ENCOUNTER — Ambulatory Visit (INDEPENDENT_AMBULATORY_CARE_PROVIDER_SITE_OTHER): Payer: No Typology Code available for payment source | Admitting: Sports Medicine

## 2013-06-27 ENCOUNTER — Other Ambulatory Visit (HOSPITAL_COMMUNITY)
Admission: RE | Admit: 2013-06-27 | Discharge: 2013-06-27 | Disposition: A | Discharge status: Home / Self Care | Payer: No Typology Code available for payment source | Source: Ambulatory Visit | Attending: Family Medicine | Admitting: Family Medicine

## 2013-06-27 ENCOUNTER — Ambulatory Visit (INDEPENDENT_AMBULATORY_CARE_PROVIDER_SITE_OTHER): Payer: No Typology Code available for payment source | Admitting: *Deleted

## 2013-06-27 ENCOUNTER — Encounter: Payer: Self-pay | Admitting: Sports Medicine

## 2013-06-27 VITALS — BP 131/79 | HR 89 | Temp 98.7°F | Ht 64.5 in | Wt 314.7 lb

## 2013-06-27 DIAGNOSIS — Z111 Encounter for screening for respiratory tuberculosis: Secondary | ICD-10-CM

## 2013-06-27 DIAGNOSIS — R87610 Atypical squamous cells of undetermined significance on cytologic smear of cervix (ASC-US): Secondary | ICD-10-CM

## 2013-06-27 DIAGNOSIS — Z1151 Encounter for screening for human papillomavirus (HPV): Secondary | ICD-10-CM | POA: Insufficient documentation

## 2013-06-27 DIAGNOSIS — E669 Obesity, unspecified: Secondary | ICD-10-CM

## 2013-06-27 DIAGNOSIS — Z01419 Encounter for gynecological examination (general) (routine) without abnormal findings: Secondary | ICD-10-CM | POA: Insufficient documentation

## 2013-06-27 DIAGNOSIS — Z124 Encounter for screening for malignant neoplasm of cervix: Secondary | ICD-10-CM

## 2013-06-27 DIAGNOSIS — Z0000 Encounter for general adult medical examination without abnormal findings: Secondary | ICD-10-CM

## 2013-06-27 NOTE — Progress Notes (Signed)
PPD Placement note Monica Foster, 32 y.o. female is here today for placement of PPD test Reason for PPD test: work Pt taken PPD test before: yes Verified in allergy area and with patient that they are not allergic to the products PPD is made of (Phenol or Tween). Yes Is patient taking any oral or IV steroid medication now or have they taken it in the last month? no Has the patient ever received the BCG vaccine?: no Has the patient been in recent contact with anyone known or suspected of having active TB disease?: no    O: Alert and oriented in NAD. P:  PPD placed on 06/27/2013.  Patient advised to return for reading within 48-72 hours. Wyatt Haste, RN-BSN

## 2013-06-27 NOTE — Progress Notes (Signed)
  [facility]  Patient name: Monica Foster MRN [id number]  Date of birth: 11-10-1980  CC & HPI:  Monica Foster is a 32 y.o. female presenting to clinic.  Patient presents today for annual exam.  She is due for a Pap smear and has had ASCUS in the past with a normal colposcopy.  She reports otherwise things have been going well.  She is recently graduated from school and is wanting to work for the [employer] system.  Therapeutic lifestyle changes were also discussed.  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  She does work at both angles.Pt denies chest pain, dyspnea at rest or exertion, PND, lower extremity edema.  ROS:  PER HPI  Pertinent History Reviewed:  Medical & Surgical Hx:  Reviewed: Significant for ASCUS Pap. Medications: Reviewed & Updated - see associated section Social History: Reviewed -  reports that she has never smoked. She does not have any smokeless tobacco history on file.  Objective Findings:  Vitals: BP 131/79  Pulse 89  Temp(Src) 98.7 F (37.1 C) (Oral)  Ht 5' 4.5" (1.638 m)  Wt 314 lb 11.2 oz (142.747 kg)  BMI 53.2 kg/m2 PE: GENERAL:  adult obese African American female. In no discomfort; no respiratory distress  PSYCH:  alert and appropriate, good insight   HNEENT:    H&N: AT/Oso, trachea midline  Eyes: no scleral icterus, no conjunctival exudate  Ears: normal  Nose: normal  Oropharynx: MMM  Dentention:     CARDIO:  RRR, S1/S2 heard, no murmur  LUNGS:  CTA B, no wheezes, no crackles  ABDOMEN:    EXTREM:  moves all 4 extremities spontaneously , there is no lower extremity edema.    GU: Chaperone: CMA  External: no skin lesions, normal appearing genitalia, no discharge  Vagina: no vaginal lesion, vaginal mucosa moist  Cervix: no cervical lesions, no CMT  Uterus: normal size, shape, consistency  non-tender   Adenexa: no masses, non-tender  TESTS:  PAP     SKIN:   NEUROMSK:     Assessment & Plan:   1. Annual physical exam   2. Screening for malignant neoplasm of the cervix    See problem associated charting

## 2013-06-30 ENCOUNTER — Telehealth: Payer: Self-pay | Admitting: Sports Medicine

## 2013-06-30 ENCOUNTER — Encounter: Payer: Self-pay | Admitting: *Deleted

## 2013-06-30 ENCOUNTER — Ambulatory Visit: Payer: No Typology Code available for payment source | Admitting: *Deleted

## 2013-06-30 DIAGNOSIS — Z111 Encounter for screening for respiratory tuberculosis: Secondary | ICD-10-CM

## 2013-06-30 NOTE — Telephone Encounter (Signed)
Pt came into office to have ppd read pt stated that Dr. Berline Chough needed the results to finish the physical.

## 2013-06-30 NOTE — Progress Notes (Signed)
PPD Reading Note PPD read and results entered in EpicCare. Result: 0 mm induration. Interpretation: negative Letter given regarding results. Elizabeth Jemima Petko, RN-BSN  

## 2013-07-01 NOTE — Telephone Encounter (Signed)
PPD needs to be placed and to be read 48-72 hours later.  I'm not sure if this happened today but this does need to be completed for me to finish her physical.

## 2013-07-02 ENCOUNTER — Encounter: Payer: Self-pay | Admitting: Sports Medicine

## 2013-07-03 NOTE — Assessment & Plan Note (Signed)
Pap smear was repeated today.  It has been greater than 3 years since her last Pap smear and coloscopy.  She was hesitant due to concerns over billing.  She was willing to accept pathologist Bill if required due to abnormal Pap smear.  She also understood with an abnormal Pap smear colposcopy would be needed with additional pathology charge at that time.

## 2013-07-03 NOTE — Assessment & Plan Note (Signed)
>  50% of this 25 minute visit spent in direct patient counseling and/or coordination of care.

## 2016-06-19 ENCOUNTER — Telehealth: Payer: Self-pay | Admitting: Adult Health

## 2016-06-20 NOTE — Telephone Encounter (Signed)
Error opened wrong chart! AHB

## 2017-05-11 ENCOUNTER — Emergency Department (HOSPITAL_COMMUNITY)
Admission: EM | Admit: 2017-05-11 | Discharge: 2017-05-11 | Disposition: A | Discharge status: Home / Self Care | Payer: No Typology Code available for payment source | Attending: Emergency Medicine | Admitting: Emergency Medicine

## 2017-05-11 ENCOUNTER — Emergency Department (HOSPITAL_COMMUNITY): Payer: No Typology Code available for payment source

## 2017-05-11 ENCOUNTER — Encounter (HOSPITAL_COMMUNITY): Payer: Self-pay | Admitting: Emergency Medicine

## 2017-05-11 DIAGNOSIS — S29001A Unspecified injury of muscle and tendon of front wall of thorax, initial encounter: Secondary | ICD-10-CM | POA: Diagnosis present

## 2017-05-11 DIAGNOSIS — S20219A Contusion of unspecified front wall of thorax, initial encounter: Secondary | ICD-10-CM | POA: Diagnosis not present

## 2017-05-11 DIAGNOSIS — Y998 Other external cause status: Secondary | ICD-10-CM | POA: Diagnosis not present

## 2017-05-11 DIAGNOSIS — J45909 Unspecified asthma, uncomplicated: Secondary | ICD-10-CM | POA: Insufficient documentation

## 2017-05-11 DIAGNOSIS — Y92410 Unspecified street and highway as the place of occurrence of the external cause: Secondary | ICD-10-CM | POA: Insufficient documentation

## 2017-05-11 DIAGNOSIS — Y939 Activity, unspecified: Secondary | ICD-10-CM | POA: Diagnosis not present

## 2017-05-11 DIAGNOSIS — S8001XA Contusion of right knee, initial encounter: Secondary | ICD-10-CM | POA: Insufficient documentation

## 2017-05-11 DIAGNOSIS — S60211A Contusion of right wrist, initial encounter: Secondary | ICD-10-CM | POA: Insufficient documentation

## 2017-05-11 MED ORDER — TRAMADOL HCL 50 MG PO TABS: 50.0000 mg | ORAL_TABLET | Freq: Four times a day (QID) | ORAL | 0 refills | Status: AC | PRN
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
## 2017-05-11 NOTE — Discharge Instructions (Signed)
Follow up with your md next week if problems °
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
## 2017-05-11 NOTE — ED Notes (Signed)
Pt verbalized understanding of d/c instructions and has no further questions. Pt is stable, A&Ox4, VSS.  

## 2017-05-14 ENCOUNTER — Encounter (HOSPITAL_COMMUNITY): Payer: Self-pay | Admitting: Emergency Medicine

## 2017-05-14 ENCOUNTER — Ambulatory Visit (HOSPITAL_COMMUNITY)
Admission: EM | Admit: 2017-05-14 | Discharge: 2017-05-14 | Disposition: A | Discharge status: Home / Self Care | Payer: Self-pay | Attending: Internal Medicine | Admitting: Internal Medicine

## 2017-05-14 DIAGNOSIS — R0789 Other chest pain: Secondary | ICD-10-CM

## 2017-05-14 MED ORDER — NAPROXEN 500 MG PO TABS: 500.0000 mg | ORAL_TABLET | Freq: Two times a day (BID) | ORAL | 0 refills | Status: AC
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
